# Patient Record
Sex: Female | Born: 1965 | Race: White | Hispanic: No | Marital: Married | Smoking: Never smoker
Health system: Southern US, Community
[De-identification: ages and names within clinical notes are randomized; demographics above are authoritative.]

## PROBLEM LIST (undated history)

## (undated) DIAGNOSIS — F329 Major depressive disorder, single episode, unspecified: Secondary | ICD-10-CM

## (undated) DIAGNOSIS — M779 Enthesopathy, unspecified: Secondary | ICD-10-CM

## (undated) DIAGNOSIS — F419 Anxiety disorder, unspecified: Secondary | ICD-10-CM

## (undated) DIAGNOSIS — F32A Depression, unspecified: Secondary | ICD-10-CM

## (undated) HISTORY — PX: COLPOSCOPY: SHX161

## (undated) HISTORY — DX: Anxiety disorder, unspecified: F41.9

## (undated) HISTORY — PX: DILATION AND CURETTAGE OF UTERUS: SHX78

## (undated) HISTORY — DX: Depression, unspecified: F32.A

## (undated) HISTORY — DX: Enthesopathy, unspecified: M77.9

## (undated) HISTORY — DX: Major depressive disorder, single episode, unspecified: F32.9

## (undated) HISTORY — PX: CARPAL TUNNEL RELEASE: SHX101

## (undated) HISTORY — PX: LASIK: SHX215

---

## 1990-02-27 HISTORY — PX: KNEE SURGERY: SHX244

## 2006-02-22 ENCOUNTER — Ambulatory Visit: Payer: Self-pay | Admitting: Specialist

## 2007-06-13 ENCOUNTER — Emergency Department: Payer: Self-pay | Admitting: Internal Medicine

## 2008-01-07 ENCOUNTER — Ambulatory Visit: Payer: Self-pay | Admitting: Unknown Physician Specialty

## 2010-06-14 ENCOUNTER — Ambulatory Visit: Payer: Self-pay | Admitting: Unknown Physician Specialty

## 2014-05-26 ENCOUNTER — Ambulatory Visit: Payer: Self-pay | Admitting: Obstetrics and Gynecology

## 2014-06-10 ENCOUNTER — Ambulatory Visit
Admit: 2014-06-10 | Disposition: A | Discharge status: Home / Self Care | Payer: Self-pay | Attending: Obstetrics and Gynecology | Admitting: Obstetrics and Gynecology

## 2014-08-13 ENCOUNTER — Ambulatory Visit (INDEPENDENT_AMBULATORY_CARE_PROVIDER_SITE_OTHER): Payer: BC Managed Care – PPO

## 2014-08-13 ENCOUNTER — Ambulatory Visit (INDEPENDENT_AMBULATORY_CARE_PROVIDER_SITE_OTHER): Payer: BC Managed Care – PPO | Admitting: Podiatry

## 2014-08-13 ENCOUNTER — Encounter: Payer: Self-pay | Admitting: Podiatry

## 2014-08-13 VITALS — BP 98/71 | HR 97 | Resp 17

## 2014-08-13 DIAGNOSIS — M79671 Pain in right foot: Secondary | ICD-10-CM | POA: Diagnosis not present

## 2014-08-13 DIAGNOSIS — M7671 Peroneal tendinitis, right leg: Secondary | ICD-10-CM | POA: Diagnosis not present

## 2014-08-13 MED ORDER — MELOXICAM 15 MG PO TABS
15.0000 mg | ORAL_TABLET | Freq: Every day | ORAL | Status: DC
Start: 2014-08-13 — End: 2018-09-06

## 2014-08-13 MED ORDER — MELOXICAM 15 MG PO TABS: 15.0000 mg | ORAL_TABLET | Freq: Every day | ORAL | Status: DC

## 2014-08-13 NOTE — Patient Instructions (Signed)
Peroneal Tendinitis with Rehab Tendonitis is inflammation of a tendon. Inflammation of the tendons on the back of the outer ankle (peroneal tendons) is known as peroneal tendonitis. The peroneal tendons are responsible for connecting the muscles that allow you to stand on your tiptoes to the bones of the ankle. For this reason, peroneal tendonitis often causes pain when trying to complete such motions. Peroneal tendonitis often involves a tear (strain) of the peroneal tendons. Strains are classified into three categories. Grade 1 strains cause pain, but the tendon is not lengthened. Grade 2 strains include a lengthened ligament, due to the ligament being stretched or partially ruptured. With grade 2 strains there is still function, although function may be decreased. Grade 3 strains involve a complete tear of the tendon or muscle, and function is usually impaired. SYMPTOMS   Pain, tenderness, swelling, warmth, or redness over the back of the outer side of the ankle, the outer part of the mid-foot, or the bottom of the arch.  Pain that gets worse with ankle motion (especially when pushing off or pushing down with the front of the foot), or when standing on the ball of the foot or pushing the foot outward.  Crackling sound (crepitation) when the tendon is moved or touched. CAUSES  Peroneal tendinitis occurs when injury to the peroneal tendons causes the body to respond with inflammation. Common causes of injury include:  An overuse injury, in which the groove behind the outer ankle (where the tendon is located) causes wear on the tendon.  A sudden stress placed on the tendon, such as from an increase in the intensity, frequency, or duration of training.  Direct hit (trauma) to the tendon.  Return to activity too soon after a previous ankle injury. RISK INCREASES WITH:  Sports that require sudden, repetitive pushing off of the foot, such as jumping or quick starts.  Kicking and running sports,  especially running down hills or long distances.  Poor strength and flexibility.  Previous injury to the foot, ankle, or leg. PREVENTION  Warm up and stretch properly before activity.  Allow for adequate recovery between workouts.  Maintain physical fitness:  Strength, flexibility, and endurance.  Cardiovascular fitness.  Complete rehabilitation after previous injury. PROGNOSIS  If treated properly, peroneal tendonitis usually heals within 6 weeks.  RELATED COMPLICATIONS  Longer healing time, if not properly treated or if not given enough time to heal.  Recurring symptoms if activity is resumed too soon, with overuse, or when using poor technique.  If untreated, tendinitis may result in tendon rupture, requiring surgery. TREATMENT  Treatment first involves the use of ice and medicine to reduce pain and inflammation. The use of strengthening and stretching exercises may help reduce pain with activity. These exercises may be performed at home or with a therapist. Sometimes, the foot and ankle will be restrained for 10 to 14 days to promote healing. Your caregiver may advise that you place a heel lift in your shoes to reduce the stress placed on the tendon. If nonsurgical treatment is unsuccessful, surgery to remove the inflamed tendon lining (sheath) may be advised.  MEDICATION   If pain medicine is needed, nonsteroidal anti-inflammatory medicines (aspirin and ibuprofen), or other minor pain relievers (acetaminophen), are often advised.  Do not take pain medicine for 7 days before surgery.  Prescription pain relievers may be given, if your caregiver thinks they are needed. Use only as directed and only as much as you need. HEAT AND COLD  Cold treatment (icing) should   be applied for 10 to 15 minutes every 2 to 3 hours for inflammation and pain, and immediately after activity that aggravates your symptoms. Use ice packs or an ice massage.  Heat treatment may be used before  performing stretching and strengthening activities prescribed by your caregiver, physical therapist, or athletic trainer. Use a heat pack or a warm water soak. SEEK MEDICAL CARE IF:  Symptoms get worse or do not improve in 2 to 4 weeks, despite treatment.  New, unexplained symptoms develop. (Drugs used in treatment may produce side effects.) EXERCISES RANGE OF MOTION (ROM) AND STRETCHING EXERCISES - Peroneal Tendinitis These exercises may help you when beginning to rehabilitate your injury. Your symptoms may resolve with or without further involvement from your physician, physical therapist or athletic trainer. While completing these exercises, remember:   Restoring tissue flexibility helps normal motion to return to the joints. This allows healthier, less painful movement and activity.  An effective stretch should be held for at least 30 seconds.  A stretch should never be painful. You should only feel a gentle lengthening or release in the stretched tissue. RANGE OF MOTION - Ankle Eversion  Sit with your right / left ankle crossed over your opposite knee.  Grip your foot with your opposite hand, placing your thumb on the top of your foot and your fingers across the bottom of your foot.  Gently push your foot downward with a slight rotation, so your littlest toes rise slightly toward the ceiling.  You should feel a gentle stretch on the inside of your ankle. Hold the stretch for __________ seconds. Repeat __________ times. Complete this exercise __________ times per day.  RANGE OF MOTION - Ankle Inversion  Sit with your right / left ankle crossed over your opposite knee.  Grip your foot with your opposite hand, placing your thumb on the bottom of your foot and your fingers across the top of your foot.  Gently pull your foot so the smallest toe comes toward you and your thumb pushes the inside of the ball of your foot away from you.  You should feel a gentle stretch on the outside of  your ankle. Hold the stretch for __________ seconds. Repeat __________ times. Complete this exercise __________ times per day.  RANGE OF MOTION - Ankle Plantar Flexion  Sit with your right / left leg crossed over your opposite knee.  Use your opposite hand to pull the top of your foot and toes toward you.  You should feel a gentle stretch on the top of your foot and ankle. Hold this position for __________ seconds. Repeat __________ times. Complete __________ times per day.  STRETCH - Gastroc, Standing  Place your hands on a wall.  Extend your right / left leg behind you, keeping the front knee somewhat bent.  Slightly point your toes inward on your back foot.  Keeping your right / left heel on the floor and your knee straight, shift your weight toward the wall, not allowing your back to arch.  You should feel a gentle stretch in the calf. Hold this position for __________ seconds. Repeat __________ times. Complete this stretch __________ times per day. STRETCH - Soleus, Standing  Place your hands on a wall.  Extend your right / left leg behind you, keeping the other knee somewhat bent.  Slightly point your toes inward on your back foot.  Keep your heel on the floor, bend your back knee, and slightly shift your weight over the back leg so that   you feel a gentle stretch deep in your back calf.  Hold this position for __________ seconds. Repeat __________ times. Complete this stretch __________ times per day. STRETCH - Gastrocsoleus, Standing Note: This exercise can place a lot of stress on your foot and ankle. Please complete this exercise only if specifically instructed by your caregiver.   Place the ball of your right / left foot on a step, keeping your other foot firmly on the same step.  Hold on to the wall or a rail for balance.  Slowly lift your other foot, allowing your body weight to press your heel down over the edge of the step.  You should feel a stretch in your  right / left calf.  Hold this position for __________ seconds.  Repeat this exercise with a slight bend in your knee. Repeat __________ times. Complete this stretch __________ times per day.  STRENGTHENING EXERCISES - Peroneal Tendinitis  These exercises may help you when beginning to rehabilitate your injury. They may resolve your symptoms with or without further involvement from your physician, physical therapist or athletic trainer. While completing these exercises, remember:   Muscles can gain both the endurance and the strength needed for everyday activities through controlled exercises.  Complete these exercises as instructed by your physician, physical therapist or athletic trainer. Increase the resistance and repetitions only as guided by your caregiver. STRENGTH - Dorsiflexors  Secure a rubber exercise band or tubing to a fixed object (table, pole) and loop the other end around your right / left foot.  Sit on the floor facing the fixed object. The band should be slightly tense when your foot is relaxed.  Slowly draw your foot back toward you, using your ankle and toes.  Hold this position for __________ seconds. Slowly release the tension in the band and return your foot to the starting position. Repeat __________ times. Complete this exercise __________ times per day.  STRENGTH - Towel Curls  Sit in a chair, on a non-carpeted surface.  Place your foot on a towel, keeping your heel on the floor.  Pull the towel toward your heel only by curling your toes. Keep your heel on the floor.  If instructed by your physician, physical therapist or athletic trainer, add weight to the end of the towel. Repeat __________ times. Complete this exercise __________ times per day. STRENGTH - Ankle Eversion   Secure one end of a rubber exercise band or tubing to a fixed object (table, pole). Loop the other end around your foot, just before your toes.  Place your fists between your knees.  This will focus your strengthening at your ankle.  Drawing the band across your opposite foot, away from the pole, slowly, pull your little toe out and up. Make sure the band is positioned to resist the entire motion.  Hold this position for __________ seconds.  Have your muscles resist the band, as it slowly pulls your foot back to the starting position. Repeat __________ times. Complete this exercise __________ times per day.  Document Released: 02/13/2005 Document Revised: 06/30/2013 Document Reviewed: 05/28/2008 [employer] Patient Information 2015 [employer]. This information is not intended to replace advice given to you by your health care provider. Make sure you discuss any questions you have with your health care provider.  

## 2014-08-14 NOTE — Progress Notes (Signed)
Patient ID: Lori Jenkins, female   DOB: 09/09/1965, 49 y.o.   MRN: [id number]  Subjective: 49 year old female presents the office as concerns her right foot pain which has been ongoing for which his been going on for approximately one month. She denies any history of injury or trauma. Denies any change or increase in activity on the time of onset of symptoms.  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 She states that she's had swelling to the outside part of her foot and she has pain with weightbearing/pressure. She states that she's been taking meloxicam which seems to help alleviate her pain. She's been icing the area as well as seems to help. No other complaints at this time.   ROS: All other systems were reviewed and are negative except for otherwise stated above.   Objective: AAO x3, NAD DP/PT pulses palpable bilaterally, CRT less than 3 seconds Protective sensation intact with Simms Weinstein monofilament, vibratory sensation intact, Achilles tendon reflex intact There is tenderness well patient on lateral aspect of the right foot overlying the course of the peroneal tendons inferior to the lateral malleolus and along the insertion of the fifth metatarsal base. There is tenderness along the fifth metatarsal base however there is no pain with vibratory sensation overlying the area. There is mild edema overlying the area without any overlying erythema or increase in warmth. There is mild discomfort with eversion of the foot. The peroneal tendons appear to be intact. There is no other areas of tenderness to bilateral lower extremities. MMT 5/5, ROM WNL.  No open lesions or pre-ulcerative lesions.  No other areas of overlying edema, erythema, increase in warmth to bilateral lower extremities.  No pain with calf compression, swelling, warmth, erythema bilaterally.   Assessment: 49 year old female right lateral foot pain, likely  insertional peroneal tendinitis  Plan: -X-rays were obtained and reviewed with the patient.  -Treatment options discussed including all alternatives, risks, and complications -Discussed likely etiology of her symptoms. -At this time recommended immobilization ankle brace for which he has at home. -Prescribed meloxicam. Discussed side effects the medication and directed to stop if any are to occur and call the office. -Ice to the area. -Discussed steroid injection however wishes to hold off at this time. -Continue the above exercises for approximate one to 2 weeks. After that point as her pain is decreased he can start rehabilitation exercises of the peroneal tendon. These exercises were discussed with her today. -Follow-up in 4 weeks or sooner if any problems are to arise. In the meantime I encouraged her to call the office with any questions, concerns, change in symptoms.

## 2014-09-17 ENCOUNTER — Ambulatory Visit: Payer: BC Managed Care – PPO | Admitting: Podiatry

## 2014-10-01 ENCOUNTER — Ambulatory Visit: Payer: BC Managed Care – PPO | Admitting: Podiatry

## 2014-10-15 ENCOUNTER — Ambulatory Visit: Payer: BC Managed Care – PPO | Admitting: Podiatry

## 2015-06-14 ENCOUNTER — Other Ambulatory Visit: Payer: Self-pay | Admitting: Obstetrics and Gynecology

## 2015-06-14 DIAGNOSIS — Z1231 Encounter for screening mammogram for malignant neoplasm of breast: Secondary | ICD-10-CM

## 2015-06-25 ENCOUNTER — Other Ambulatory Visit: Payer: Self-pay | Admitting: Obstetrics and Gynecology

## 2015-06-25 ENCOUNTER — Ambulatory Visit
Admission: RE | Admit: 2015-06-25 | Discharge: 2015-06-25 | Disposition: A | Discharge status: Home / Self Care | Payer: BC Managed Care – PPO | Source: Ambulatory Visit | Attending: Obstetrics and Gynecology | Admitting: Obstetrics and Gynecology

## 2015-06-25 ENCOUNTER — Ambulatory Visit: Payer: Self-pay

## 2015-06-25 DIAGNOSIS — Z1231 Encounter for screening mammogram for malignant neoplasm of breast: Secondary | ICD-10-CM | POA: Diagnosis not present

## 2016-07-31 ENCOUNTER — Ambulatory Visit (INDEPENDENT_AMBULATORY_CARE_PROVIDER_SITE_OTHER): Payer: BC Managed Care – PPO | Admitting: Obstetrics and Gynecology

## 2016-07-31 ENCOUNTER — Encounter: Payer: Self-pay | Admitting: Obstetrics and Gynecology

## 2016-07-31 VITALS — BP 116/70 | HR 98 | Ht 65.0 in | Wt 166.0 lb

## 2016-07-31 DIAGNOSIS — F419 Anxiety disorder, unspecified: Secondary | ICD-10-CM

## 2016-07-31 DIAGNOSIS — Z01419 Encounter for gynecological examination (general) (routine) without abnormal findings: Secondary | ICD-10-CM

## 2016-07-31 DIAGNOSIS — F329 Major depressive disorder, single episode, unspecified: Secondary | ICD-10-CM | POA: Diagnosis not present

## 2016-07-31 DIAGNOSIS — Z124 Encounter for screening for malignant neoplasm of cervix: Secondary | ICD-10-CM | POA: Diagnosis not present

## 2016-07-31 LAB — HM PAP SMEAR: HM Pap smear: NORMAL

## 2016-07-31 NOTE — Progress Notes (Signed)
Routine Annual Gynecology Examination   PCP: Will Bonnet, MD  Chief Complaint  Patient presents with  . Gynecologic Exam    History of Present Illness: Patient is a 51 y.o. [id number] presents for annual exam. The patient has no complaints today.   Normal menses, coming monthly, lasting 3 days, no menorrhagia, no intermenstrual bleeding  Menopausal symptoms: denies  Breast symptoms: denies  Last pap smear: 3 years ago.  Result Normal, HPV negative  Last mammogram: 1 years ago.  Result Normal   Past Medical History:  Diagnosis Date  . Anxiety and depression   . Tendonitis     Past Surgical History:  Procedure Laterality Date  . CARPAL TUNNEL RELEASE    . CESAREAN SECTION    . CESAREAN SECTION WITH BILATERAL TUBAL LIGATION    . COLPOSCOPY    . DILATION AND CURETTAGE OF UTERUS    . KNEE SURGERY Left 1992    Medications:   Medication Sig Start Date End Date Taking? Authorizing Provider  buPROPion [facility] SR) 150 MG 12 hr tablet  07/21/14   [provider]  meloxicam (MOBIC) 15 MG tablet Take 1 tablet (15 mg total) by mouth daily. 08/13/14   Trula Slade, DPM  sertraline (ZOLOFT) 50 MG tablet  06/22/14   [provider]    Allergies  Allergen Reactions  . Codeine     Gynecologic History:  Patient's last menstrual period was 06/11/2016 (approximate). Contraception: tubal ligation Last Pap: 3 years ago. Results were: normal Last mammogram: 1 year ago. Results were: normal  Obstetric History: [id number]   Social History   Social History  . Marital status: Single    Spouse name: N/A  . Number of children: N/A  . Years of education: N/A   Occupational History  . Not on file.   Social History Main Topics  . Smoking status: Never Smoker  . Smokeless tobacco: Never Used  . Alcohol use No  . Drug use: No  . Sexual activity: Yes   Other Topics Concern  . Not on file   Social History Narrative  . No narrative on file     Family History  Problem Relation Age of Onset  . Breast cancer Maternal Aunt   . Heart attack Father     Review of Systems  Constitutional: Negative.   HENT: Negative.   Eyes: Negative.   Respiratory: Negative.   Cardiovascular: Negative.   Gastrointestinal: Negative.   Genitourinary: Negative.   Musculoskeletal: Negative.   Skin: Negative.   Neurological: Negative.   Psychiatric/Behavioral: Negative.      Physical Exam Vitals: BP 116/70 (BP Location: Left Arm, Patient Position: Sitting, Cuff Size: Normal)   Pulse 98   Ht 5\' 5"  (1.651 m)   Wt 166 lb (75.3 kg)   LMP 06/11/2016 (Approximate)   BMI 27.62 kg/m   Physical Exam  Constitutional: She is oriented to person, place, and time and well-developed, well-nourished, and in no distress. No distress.  HENT:  Head: Normocephalic and atraumatic.  Eyes: Conjunctivae are normal. Left eye exhibits no discharge. No scleral icterus.  Neck: Normal range of motion. Neck supple. No thyromegaly present.  Cardiovascular: Normal rate, regular rhythm and normal heart sounds.  Exam reveals no gallop and no friction rub.   No murmur heard. Pulmonary/Chest: Effort normal and breath sounds normal. No respiratory distress. She has no wheezes. She has no rales. Right breast exhibits no inverted nipple, no mass, no nipple discharge, no skin change and  no tenderness. Left breast exhibits no inverted nipple, no mass, no nipple discharge, no skin change and no tenderness. Breasts are symmetrical.  Abdominal: Soft. Bowel sounds are normal. She exhibits no distension and no mass. There is no tenderness. There is no rebound and no guarding.  Genitourinary: Vagina normal, uterus normal, cervix normal, right adnexa normal, left adnexa normal and vulva normal.  Musculoskeletal: Normal range of motion. She exhibits no edema.  Lymphadenopathy:    She has no cervical adenopathy.  Neurological: She is alert and oriented to person, place, and time. No  cranial nerve deficit.  Skin: Skin is warm and dry. No rash noted.  Psychiatric: Mood, affect and judgment normal.     Female chaperone present for pelvic and breast  portions of the physical exam  Results: PHQ-9: 1   Assessment and Plan:  51 y.o. [id number] female here for routine gynecologic examination, doing well.  Anxiety and depression are well-controlled on medication.   Plan: Problem List Items Addressed This Visit    Anxiety and depression   Relevant Medications   buPROPion (WELLBUTRIN SR) 150 MG 12 hr tablet   sertraline (ZOLOFT) 50 MG tablet    Other Visit Diagnoses    Women's annual routine gynecological examination    -  Primary   Relevant Orders   IGP, Aptima HPV, rfx 16/18,45   Pap smear for cervical cancer screening       Relevant Orders   IGP, Aptima HPV, rfx 16/18,45      Screening: -- Blood pressure screen normal. -- Colonoscopy - due.  States she is establishing a new PCP and would like to have it ordered through her PCP. So, will await to see whether that is performed.  -- Mammogram - due. Patient will call [facility] to schedule. She may have her mammogram performed in [place], if that is more convenient for her.  -- Weight screening: overweight. Continue to monitor.  -- Nutrition: normal -- cholesterol screening: n/a -- osteoporosis screening: n/a -- tobacco screening: not using -- alcohol screening: AUDIT questionnaire indicates low-risk usage. -- family history of breast cancer screening: done. not at high risk. -- no evidence of domestic violence or intimate partner violence. -- STD screening: gonorrhea/chlamydia NAAT not collected per patient request. -- pap smear collected.  Anxiety and depression: well-controlled on current medication.  She does not want to come off the medication and has been stable on the medication for a while.  Will continue for now.   Prentice Docker, MD 08/02/2016 2:28 PM

## 2016-08-02 ENCOUNTER — Encounter: Payer: Self-pay | Admitting: Obstetrics and Gynecology

## 2016-08-02 DIAGNOSIS — F329 Major depressive disorder, single episode, unspecified: Secondary | ICD-10-CM | POA: Insufficient documentation

## 2016-08-02 DIAGNOSIS — F419 Anxiety disorder, unspecified: Secondary | ICD-10-CM

## 2016-08-02 DIAGNOSIS — F32A Depression, unspecified: Secondary | ICD-10-CM | POA: Insufficient documentation

## 2016-08-02 MED ORDER — SERTRALINE HCL 50 MG PO TABS: 50.0000 mg | ORAL_TABLET | Freq: Every day | ORAL | 3 refills | Status: DC

## 2016-08-02 MED ORDER — BUPROPION HCL ER (SR) 150 MG PO TB12: 150.0000 mg | ORAL_TABLET | Freq: Two times a day (BID) | ORAL | 3 refills | Status: DC

## 2016-08-03 ENCOUNTER — Encounter: Payer: Self-pay | Admitting: Obstetrics and Gynecology

## 2016-08-03 LAB — IGP, APTIMA HPV, RFX 16/18,45
HPV APTIMA: NEGATIVE
PAP SMEAR COMMENT: 0

## 2017-11-07 ENCOUNTER — Ambulatory Visit (INDEPENDENT_AMBULATORY_CARE_PROVIDER_SITE_OTHER): Payer: BC Managed Care – PPO | Admitting: Obstetrics and Gynecology

## 2017-11-07 ENCOUNTER — Encounter: Payer: Self-pay | Admitting: Obstetrics and Gynecology

## 2017-11-07 VITALS — BP 118/74 | Ht 65.0 in | Wt 162.0 lb

## 2017-11-07 DIAGNOSIS — G4709 Other insomnia: Secondary | ICD-10-CM | POA: Diagnosis not present

## 2017-11-07 DIAGNOSIS — G4700 Insomnia, unspecified: Secondary | ICD-10-CM | POA: Insufficient documentation

## 2017-11-07 DIAGNOSIS — F329 Major depressive disorder, single episode, unspecified: Secondary | ICD-10-CM

## 2017-11-07 DIAGNOSIS — Z1339 Encounter for screening examination for other mental health and behavioral disorders: Secondary | ICD-10-CM

## 2017-11-07 DIAGNOSIS — Z1231 Encounter for screening mammogram for malignant neoplasm of breast: Secondary | ICD-10-CM

## 2017-11-07 DIAGNOSIS — F419 Anxiety disorder, unspecified: Secondary | ICD-10-CM

## 2017-11-07 DIAGNOSIS — Z01419 Encounter for gynecological examination (general) (routine) without abnormal findings: Secondary | ICD-10-CM | POA: Diagnosis not present

## 2017-11-07 DIAGNOSIS — F418 Other specified anxiety disorders: Secondary | ICD-10-CM | POA: Diagnosis not present

## 2017-11-07 DIAGNOSIS — Z1239 Encounter for other screening for malignant neoplasm of breast: Secondary | ICD-10-CM

## 2017-11-07 DIAGNOSIS — Z1331 Encounter for screening for depression: Secondary | ICD-10-CM | POA: Diagnosis not present

## 2017-11-07 MED ORDER — SERTRALINE HCL 100 MG PO TABS: 100.0000 mg | ORAL_TABLET | Freq: Every day | ORAL | 3 refills | Status: DC

## 2017-11-07 MED ORDER — BUPROPION HCL ER (XL) 300 MG PO TB24: 300.0000 mg | ORAL_TABLET | Freq: Every day | ORAL | 3 refills | Status: DC

## 2017-11-07 MED ORDER — TRAZODONE HCL 50 MG PO TABS: ORAL_TABLET | ORAL | 0 refills | Status: DC

## 2017-11-07 NOTE — Progress Notes (Signed)
Routine Annual Gynecology Examination   PCP: Will Bonnet, MD  Chief Complaint  Patient presents with  . Annual Exam   History of Present Illness: Patient is a 52 y.o. [id number] presents for annual exam. The patient has no complaints today.   Menopausal bleeding: denies (> 1 year since last menses)  Menopausal symptoms: denies  Breast symptoms: denies  Last pap smear: 1 year ago.  Result Normal  Last mammogram: 2 years ago.  Result Normal  Depression: She continues to take sertraline and bupropion for depression and anxiety.  She has noted in mid July that her depression has gotten worse.  She has felt overwhelming between coaching, teaching class.  She is also caring for her mother who has dementia.  She has two daughters: one is in college and one is in 10th grade.  The worst feeling is that she just feels overwhelmed.  Denies SI/HI.  She spends an inordinate amount of time at work and has very little time for herself.  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  She has no new stressors in her life she can identify.  She is not sleeping well.  She has no trouble falling asleep.  Staying asleep is difficult.    Past Medical History:  Diagnosis Date  . Anxiety and depression   . Tendonitis    Past Surgical History:  Procedure Laterality Date  . CARPAL TUNNEL RELEASE    . CARPAL TUNNEL RELEASE    . CESAREAN SECTION    . CESAREAN SECTION WITH BILATERAL TUBAL LIGATION    . COLPOSCOPY    . DILATION AND CURETTAGE OF UTERUS    . KNEE SURGERY Left 1992   Prior to Admission medications   Medication Sig Start Date End Date Taking? Authorizing Provider  buPROPion (WELLBUTRIN SR) 150 MG 12 hr tablet Take 1 tablet (150 mg total) by mouth 2 (two) times daily. 08/02/16 10/31/16  Will Bonnet, MD  meloxicam (MOBIC) 15 MG tablet Take 1 tablet (15 mg total) by mouth daily. Patient not taking: Reported on 11/07/2017 08/13/14   Trula Slade, DPM  sertraline (ZOLOFT)  50 MG tablet Take 1 tablet (50 mg total) by mouth daily. 08/02/16 10/31/16  Will Bonnet, MD    Allergies  Allergen Reactions  . Codeine    Obstetric History: [id number]  Social History   Socioeconomic History  . Marital status: Single    Spouse name: Not on file  . Number of children: Not on file  . Years of education: Not on file  . Highest education level: Not on file  Occupational History  . Not on file  Social Needs  . Financial resource strain: Not on file  . Food insecurity:    Worry: Not on file    Inability: Not on file  . Transportation needs:    Medical: Not on file    Non-medical: Not on file  Tobacco Use  . Smoking status: Never Smoker  . Smokeless tobacco: Never Used  Substance and Sexual Activity  . Alcohol use: No    Alcohol/week: 0.0 standard drinks  . Drug use: No  . Sexual activity: Yes  Lifestyle  . Physical activity:    Days per week: Not on file    Minutes per session: Not on file  . Stress: Not on file  Relationships  . Social connections:    Talks on phone: Not on file    Gets together: Not on file  Attends religious service: Not on file    Active member of club or organization: Not on file    Attends meetings of clubs or organizations: Not on file    Relationship status: Not on file  . Intimate partner violence:    Fear of current or ex partner: Not on file    Emotionally abused: Not on file    Physically abused: Not on file    Forced sexual activity: Not on file  Other Topics Concern  . Not on file  Social History Narrative  . Not on file    Family History  Problem Relation Age of Onset  . Breast cancer Maternal Aunt   . Heart attack Father     Review of Systems  Constitutional: Negative.   HENT: Negative.   Eyes: Negative.   Respiratory: Negative.   Cardiovascular: Negative.   Gastrointestinal: Negative.   Genitourinary: Negative.   Musculoskeletal: Negative.   Skin: Negative.   Neurological: Negative.     Psychiatric/Behavioral: Positive for depression. Negative for hallucinations, memory loss, substance abuse and suicidal ideas. The patient is nervous/anxious and has insomnia.      Physical Exam Vitals: BP 118/74   Ht 5\' 5"  (1.651 m)   Wt 162 lb (73.5 kg)   BMI 26.96 kg/m   Physical Exam  Constitutional: She is oriented to person, place, and time. She appears well-developed and well-nourished. No distress.  Genitourinary: Uterus normal. Pelvic exam was performed with patient supine. There is no rash, tenderness, lesion or injury on the right labia. There is no rash, tenderness, lesion or injury on the left labia. No erythema, tenderness or bleeding in the vagina. No signs of injury around the vagina. No vaginal discharge found. Right adnexum does not display mass, does not display tenderness and does not display fullness. Left adnexum does not display mass, does not display tenderness and does not display fullness. Cervix does not exhibit motion tenderness, lesion, discharge or polyp.   Uterus is mobile and anteverted. Uterus is not enlarged, tender or exhibiting a mass.  HENT:  Head: Normocephalic and atraumatic.  Eyes: EOM are normal. No scleral icterus.  Neck: Normal range of motion. Neck supple. No thyromegaly present.  Cardiovascular: Normal rate and regular rhythm. Exam reveals no gallop and no friction rub.  No murmur heard. Pulmonary/Chest: Effort normal and breath sounds normal. No respiratory distress. She has no wheezes. She has no rales. Right breast exhibits no inverted nipple, no mass, no nipple discharge, no skin change and no tenderness. Left breast exhibits no inverted nipple, no mass, no nipple discharge, no skin change and no tenderness.  Abdominal: Soft. Bowel sounds are normal. She exhibits no distension and no mass. There is no tenderness. There is no rebound and no guarding.  Musculoskeletal: Normal range of motion. She exhibits no edema or tenderness.   Lymphadenopathy:    She has no cervical adenopathy.       Right: No inguinal adenopathy present.       Left: No inguinal adenopathy present.  Neurological: She is alert and oriented to person, place, and time. No cranial nerve deficit.  Skin: Skin is warm and dry. No rash noted. No erythema.  Psychiatric: She has a normal mood and affect. Her behavior is normal. Judgment normal.     Female chaperone present for pelvic and breast  portions of the physical exam  Results: AUDIT Questionnaire (screen for alcoholism): 1 PHQ-9: 15   Assessment and Plan:  52 y.o. [id number] female here  for routine annual gynecologic examination  Plan: Problem List Items Addressed This Visit      Other   Anxiety and depression   Relevant Medications   sertraline (ZOLOFT) 100 MG tablet   buPROPion (WELLBUTRIN XL) 300 MG 24 hr tablet   traZODone (DESYREL) 50 MG tablet   Insomnia   Relevant Medications   traZODone (DESYREL) 50 MG tablet    Other Visit Diagnoses    Women's annual routine gynecological examination    -  Primary   Relevant Orders   MM DIGITAL SCREENING BILATERAL   Screening for depression       Screening for alcoholism       Screening for breast cancer       Relevant Orders   MM DIGITAL SCREENING BILATERAL      Screening: -- Blood pressure screen normal -- Colonoscopy - due - managed by PCP -- Mammogram - due. Patient to call [facility] to arrange. She understands that it is her responsibility to arrange this. -- Weight screening: overweight: continue to monitor -- Depression screening: positive.  She has a history of depression. Increased dosing.  -- Nutrition: normal -- cholesterol screening: per PCP -- osteoporosis screening: not due -- tobacco screening: not using -- alcohol screening: AUDIT questionnaire indicates low-risk usage. -- family history of breast cancer screening: done. not at high risk. -- no evidence of domestic violence or intimate partner violence. -- STD  screening: gonorrhea/chlamydia NAAT not collected per patient request. -- pap smear not collected per ASCCP guidelines -- flu vaccine to receive at work. -- HPV vaccination series: not eligilbe  Depression/anxiety: Increase zoloft to 100mg  and wellbutrin dosing (she has been prescribed wellbutrin 300 mg, but has only been taking 150 mg.  Rx today for 300 mg). List of counselors provided and she was strongly encouraged to seek a counselor in addition to her current therapy. She is able to contract for safety today. Will monitor closely.  Insomnia: prescription for trazodone today for sleep.  Discussed theoretical risk of serotonin syndrome.    20 minutes spent in face to face discussion with > 50% spent in counseling,management, and coordination of care of her .  Anxiety and depression.   Prentice Docker, MD 11/08/2017 11:02 AM

## 2017-11-08 ENCOUNTER — Encounter: Payer: Self-pay | Admitting: Obstetrics and Gynecology

## 2018-03-12 ENCOUNTER — Other Ambulatory Visit: Payer: Self-pay

## 2018-03-12 DIAGNOSIS — G4709 Other insomnia: Secondary | ICD-10-CM

## 2018-03-13 NOTE — Telephone Encounter (Signed)
Please advise 

## 2018-03-13 NOTE — Telephone Encounter (Signed)
Your pt

## 2018-03-13 NOTE — Telephone Encounter (Signed)
She is your pt. Thx

## 2018-03-14 MED ORDER — TRAZODONE HCL 50 MG PO TABS: ORAL_TABLET | ORAL | 0 refills | Status: DC

## 2018-08-23 ENCOUNTER — Other Ambulatory Visit: Payer: Self-pay | Admitting: Obstetrics and Gynecology

## 2018-08-23 DIAGNOSIS — Z1231 Encounter for screening mammogram for malignant neoplasm of breast: Secondary | ICD-10-CM

## 2018-09-05 ENCOUNTER — Other Ambulatory Visit: Payer: Self-pay

## 2018-09-05 ENCOUNTER — Ambulatory Visit
Admission: RE | Admit: 2018-09-05 | Discharge: 2018-09-05 | Disposition: A | Discharge status: Home / Self Care | Payer: BC Managed Care – PPO | Source: Ambulatory Visit | Attending: Obstetrics and Gynecology | Admitting: Obstetrics and Gynecology

## 2018-09-05 DIAGNOSIS — Z1231 Encounter for screening mammogram for malignant neoplasm of breast: Secondary | ICD-10-CM

## 2018-09-05 DIAGNOSIS — G4709 Other insomnia: Secondary | ICD-10-CM

## 2018-09-05 MED ORDER — TRAZODONE HCL 50 MG PO TABS: ORAL_TABLET | ORAL | 0 refills | Status: DC

## 2018-09-05 NOTE — Telephone Encounter (Signed)
Please advise 

## 2018-09-06 ENCOUNTER — Other Ambulatory Visit: Payer: Self-pay | Admitting: Obstetrics and Gynecology

## 2018-09-06 DIAGNOSIS — R52 Pain, unspecified: Secondary | ICD-10-CM

## 2018-09-06 MED ORDER — MELOXICAM 15 MG PO TABS: 15.0000 mg | ORAL_TABLET | Freq: Every day | ORAL | 2 refills | Status: DC | PRN

## 2018-12-13 ENCOUNTER — Encounter: Payer: Self-pay | Admitting: Obstetrics and Gynecology

## 2018-12-13 ENCOUNTER — Other Ambulatory Visit: Payer: Self-pay

## 2018-12-13 ENCOUNTER — Ambulatory Visit (INDEPENDENT_AMBULATORY_CARE_PROVIDER_SITE_OTHER): Payer: BC Managed Care – PPO | Admitting: Obstetrics and Gynecology

## 2018-12-13 VITALS — BP 122/74 | Ht 65.0 in | Wt 159.0 lb

## 2018-12-13 DIAGNOSIS — F419 Anxiety disorder, unspecified: Secondary | ICD-10-CM

## 2018-12-13 DIAGNOSIS — Z1339 Encounter for screening examination for other mental health and behavioral disorders: Secondary | ICD-10-CM

## 2018-12-13 DIAGNOSIS — R350 Frequency of micturition: Secondary | ICD-10-CM

## 2018-12-13 DIAGNOSIS — Z1331 Encounter for screening for depression: Secondary | ICD-10-CM

## 2018-12-13 DIAGNOSIS — F32A Depression, unspecified: Secondary | ICD-10-CM

## 2018-12-13 DIAGNOSIS — Z01419 Encounter for gynecological examination (general) (routine) without abnormal findings: Secondary | ICD-10-CM | POA: Diagnosis not present

## 2018-12-13 DIAGNOSIS — F329 Major depressive disorder, single episode, unspecified: Secondary | ICD-10-CM

## 2018-12-13 MED ORDER — BUPROPION HCL ER (XL) 300 MG PO TB24: 300.0000 mg | ORAL_TABLET | Freq: Every day | ORAL | 3 refills | Status: DC

## 2018-12-13 MED ORDER — SERTRALINE HCL 100 MG PO TABS: 100.0000 mg | ORAL_TABLET | Freq: Every day | ORAL | 3 refills | Status: DC

## 2018-12-13 NOTE — Progress Notes (Signed)
Routine Annual Gynecology Examination   PCP: Will Bonnet, MD  Chief Complaint  Patient presents with  . Annual Exam   History of Present Illness: Patient is a 53 y.o. [id number] presents for annual exam. The patient has no complaints today.   Menopausal bleeding: denies (> 1 year since last menses)  Menopausal symptoms: denies  Breast symptoms: denies  Last pap smear: 2 years ago.  Result Normal  Last mammogram: 3 months ago.  Result Normal  Last colon cancer screening: [place], last year. Normal.  Depression: She continues to take sertraline and bupropion for depression and anxiety.  She is doing well on her current dosing of Zoloft and Wellbutrin. She is also sleeping better with the occasional use of trazodone.    She notes lower back pain. Initially the pain was on her right side. However, she picked something recently and it was on her left side.  The pain is in her very low back.  She also notes an increase in urinary frequency. She denies urinary leakage.  She denies dysuria.  She does have nocturia (1x per night).  Nothing makes the pain better or worse. The pain is constant. She rates the pain as mild, more of a constant dull ache.  She denies any associated symptoms.  She does have new constipation.    Past Medical History:  Diagnosis Date  . Anxiety and depression   . Tendonitis    Past Surgical History:  Procedure Laterality Date  . CARPAL TUNNEL RELEASE    . CARPAL TUNNEL RELEASE    . CESAREAN SECTION    . CESAREAN SECTION WITH BILATERAL TUBAL LIGATION    . COLPOSCOPY    . DILATION AND CURETTAGE OF UTERUS    . KNEE SURGERY Left 1992   Prior to Admission medications   Medication Sig Start Date End Date Taking? Authorizing Provider  buPROPion (WELLBUTRIN SR) 150 MG 12 hr tablet Take 1 tablet (150 mg total) by mouth 2 (two) times daily. 08/02/16 10/31/16  Will Bonnet, MD  sertraline (ZOLOFT) 50 MG tablet Take 1 tablet (50 mg total) by mouth daily.  08/02/16 10/31/16  Will Bonnet, MD    Allergies  Allergen Reactions  . Codeine    Obstetric History: [id number]  Social History   Socioeconomic History  . Marital status: Married    Spouse name: Not on file  . Number of children: Not on file  . Years of education: Not on file  . Highest education level: Not on file  Occupational History  . Not on file  Social Needs  . Financial resource strain: Not on file  . Food insecurity    Worry: Not on file    Inability: Not on file  . Transportation needs    Medical: Not on file    Non-medical: Not on file  Tobacco Use  . Smoking status: Never Smoker  . Smokeless tobacco: Never Used  Substance and Sexual Activity  . Alcohol use: No    Alcohol/week: 0.0 standard drinks  . Drug use: No  . Sexual activity: Yes  Lifestyle  . Physical activity    Days per week: Not on file    Minutes per session: Not on file  . Stress: Not on file  Relationships  . Social Herbalist on phone: Not on file    Gets together: Not on file    Attends religious service: Not on file    Active member of club or  organization: Not on file    Attends meetings of clubs or organizations: Not on file    Relationship status: Not on file  . Intimate partner violence    Fear of current or ex partner: Not on file    Emotionally abused: Not on file    Physically abused: Not on file    Forced sexual activity: Not on file  Other Topics Concern  . Not on file  Social History Narrative  . Not on file    Family History  Problem Relation Age of Onset  . Breast cancer Maternal Aunt   . Heart attack Father     Review of Systems  Constitutional: Negative.   HENT: Negative.   Eyes: Negative.   Respiratory: Negative.   Cardiovascular: Negative.   Gastrointestinal: Positive for constipation. Negative for abdominal pain, blood in stool, diarrhea, heartburn, melena, nausea and vomiting.  Genitourinary: Negative.   Musculoskeletal: Negative.   Skin:  Negative.   Neurological: Negative.   Psychiatric/Behavioral: Negative for depression, hallucinations, memory loss, substance abuse and suicidal ideas. The patient is not nervous/anxious and does not have insomnia.    Physical Exam Vitals: BP 122/74   Ht 5\' 5"  (1.651 m)   Wt 159 lb (72.1 kg)   LMP 06/11/2016 (Approximate)   BMI 26.46 kg/m   Physical Exam Constitutional:      General: She is not in acute distress.    Appearance: She is well-developed.  Genitourinary:     Pelvic exam was performed with patient supine.     Uterus normal.     No signs of injury in the vagina.     No vaginal discharge, erythema, tenderness or bleeding.     No cervical motion tenderness, discharge, lesion or polyp.     Uterus is mobile.     Uterus is not enlarged or tender.     No uterine mass detected.    Uterus is anteverted.     No right or left adnexal mass present.     Right adnexa not tender or full.     Left adnexa not tender or full.  HENT:     Head: Normocephalic and atraumatic.  Eyes:     General: No scleral icterus. Neck:     Musculoskeletal: Normal range of motion and neck supple.     Thyroid: No thyromegaly.  Cardiovascular:     Rate and Rhythm: Normal rate and regular rhythm.     Heart sounds: No murmur. No friction rub. No gallop.   Pulmonary:     Effort: Pulmonary effort is normal. No respiratory distress.     Breath sounds: Normal breath sounds. No wheezing or rales.  Chest:     Breasts:        Right: No inverted nipple, mass, nipple discharge, skin change or tenderness.        Left: No inverted nipple, mass, nipple discharge, skin change or tenderness.  Abdominal:     General: Bowel sounds are normal. There is no distension.     Palpations: Abdomen is soft. There is no mass.     Tenderness: There is no abdominal tenderness. There is no guarding or rebound.  Musculoskeletal: Normal range of motion.        General: No tenderness.  Lymphadenopathy:     Cervical: No  cervical adenopathy.     Lower Body: No right inguinal adenopathy. No left inguinal adenopathy.  Neurological:     Mental Status: She is alert and oriented to person,  place, and time.     Cranial Nerves: No cranial nerve deficit.  Skin:    General: Skin is warm and dry.     Findings: No erythema or rash.  Psychiatric:        Behavior: Behavior normal.        Judgment: Judgment normal.      Female chaperone present for pelvic and breast  portions of the physical exam  Results: AUDIT Questionnaire (screen for alcoholism): 1 PHQ-9: 1  Assessment and Plan:  53 y.o. [id number] female here for routine annual gynecologic examination  Plan: Problem List Items Addressed This Visit      Other   Anxiety and depression   Relevant Medications   buPROPion (WELLBUTRIN XL) 300 MG 24 hr tablet   sertraline (ZOLOFT) 100 MG tablet    Other Visit Diagnoses    Women's annual routine gynecological examination    -  Primary   Relevant Medications   buPROPion (WELLBUTRIN XL) 300 MG 24 hr tablet   sertraline (ZOLOFT) 100 MG tablet   Other Relevant Orders   Urine Culture   Screening for depression       Screening for alcoholism       Frequency of urination       Relevant Orders   Urine Culture      Screening: -- Blood pressure screen normal -- Colonoscopy - not due -- Mammogram - not due -- Weight screening: overweight: continue to monitor -- Depression screening: positive.  She has a history of depression. Increased dosing.  -- Nutrition: normal -- cholesterol screening: per PCP -- osteoporosis screening: not due -- tobacco screening: not using -- alcohol screening: AUDIT questionnaire indicates low-risk usage. -- family history of breast cancer screening: done. not at high risk. -- no evidence of domestic violence or intimate partner violence. -- STD screening: gonorrhea/chlamydia NAAT not collected per patient request. -- pap smear not collected per ASCCP guidelines -- flu vaccine  to receive at work. -- HPV vaccination series: not eligilbe  Depression/anxiety: Continue current medication as it seems to be working well.   Frequency: urine culture  Prentice Docker, MD 12/13/2018 3:36 PM

## 2018-12-15 LAB — URINE CULTURE

## 2018-12-20 ENCOUNTER — Other Ambulatory Visit: Payer: Self-pay | Admitting: Obstetrics and Gynecology

## 2018-12-20 DIAGNOSIS — N3000 Acute cystitis without hematuria: Secondary | ICD-10-CM

## 2018-12-20 MED ORDER — AMOXICILLIN 875 MG PO TABS: 875.0000 mg | ORAL_TABLET | Freq: Two times a day (BID) | ORAL | 0 refills | Status: AC

## 2019-05-28 ENCOUNTER — Other Ambulatory Visit: Payer: Self-pay

## 2019-05-28 DIAGNOSIS — G4709 Other insomnia: Secondary | ICD-10-CM

## 2019-05-29 MED ORDER — TRAZODONE HCL 50 MG PO TABS: ORAL_TABLET | ORAL | 3 refills | Status: DC

## 2020-01-12 ENCOUNTER — Ambulatory Visit (INDEPENDENT_AMBULATORY_CARE_PROVIDER_SITE_OTHER): Payer: BC Managed Care – PPO | Admitting: Obstetrics and Gynecology

## 2020-01-12 ENCOUNTER — Other Ambulatory Visit: Payer: Self-pay

## 2020-01-12 ENCOUNTER — Encounter: Payer: Self-pay | Admitting: Obstetrics and Gynecology

## 2020-01-12 VITALS — BP 126/74 | Ht 65.0 in | Wt 161.0 lb

## 2020-01-12 DIAGNOSIS — Z1211 Encounter for screening for malignant neoplasm of colon: Secondary | ICD-10-CM

## 2020-01-12 DIAGNOSIS — F419 Anxiety disorder, unspecified: Secondary | ICD-10-CM | POA: Diagnosis not present

## 2020-01-12 DIAGNOSIS — F32A Depression, unspecified: Secondary | ICD-10-CM

## 2020-01-12 MED ORDER — BUPROPION HCL ER (XL) 300 MG PO TB24: 300.0000 mg | ORAL_TABLET | Freq: Every day | ORAL | 4 refills | Status: AC

## 2020-01-12 MED ORDER — SERTRALINE HCL 100 MG PO TABS: 100.0000 mg | ORAL_TABLET | Freq: Every day | ORAL | 3 refills | Status: AC

## 2020-01-12 NOTE — Progress Notes (Signed)
Obstetrics & Gynecology Office Visit   Chief Complaint  Patient presents with  . Follow-up    medication follow up   History of Present Illness: 54 y.o. Lori Jenkins female who presents for a follow up for anxiety and depression. She is currently taking sertraline and bupropion and trazodone (as needed).  She states that she is doing very well.  She has been on the medication for 24 years.  She does not want to change anything at this point.  She states that since her postpartum period she really hasn't had issues with depression, per se.  She has had mood fluctuations over the years.   Past Medical History:  Diagnosis Date  . Anxiety and depression   . Tendonitis    Past Surgical History:  Procedure Laterality Date  . CARPAL TUNNEL RELEASE    . CARPAL TUNNEL RELEASE    . CESAREAN SECTION    . CESAREAN SECTION WITH BILATERAL TUBAL LIGATION    . COLPOSCOPY    . DILATION AND CURETTAGE OF UTERUS    . KNEE SURGERY Left 1992   Gynecologic History: Patient's last menstrual period was 06/11/2016 (approximate).  Obstetric History: [id number]  Family History  Problem Relation Age of Onset  . Breast cancer Maternal Aunt   . Heart attack Father    Social History   Socioeconomic History  . Marital status: Married    Spouse name: Not on file  . Number of children: Not on file  . Years of education: Not on file  . Highest education level: Not on file  Occupational History  . Not on file  Tobacco Use  . Smoking status: Never Smoker  . Smokeless tobacco: Never Used  Vaping Use  . Vaping Use: Never used  Substance and Sexual Activity  . Alcohol use: No    Alcohol/week: 0.0 standard drinks  . Drug use: No  . Sexual activity: Yes  Other Topics Concern  . Not on file  Social History Narrative  . Not on file   Social Determinants of Health   Financial Resource Strain:   . Difficulty of Paying Living Expenses: Not on file  Food Insecurity:   . Worried About Charity fundraiser in  the Last Year: Not on file  . Ran Out of Food in the Last Year: Not on file  Transportation Needs:   . Lack of Transportation (Medical): Not on file  . Lack of Transportation (Non-Medical): Not on file  Physical Activity:   . Days of Exercise per Week: Not on file  . Minutes of Exercise per Session: Not on file  Stress:   . Feeling of Stress : Not on file  Social Connections:   . Frequency of Communication with Friends and Family: Not on file  . Frequency of Social Gatherings with Friends and Family: Not on file  . Attends Religious Services: Not on file  . Active Member of Clubs or Organizations: Not on file  . Attends Archivist Meetings: Not on file  . Marital Status: Not on file  Intimate Partner Violence:   . Fear of Current or Ex-Partner: Not on file  . Emotionally Abused: Not on file  . Physically Abused: Not on file  . Sexually Abused: Not on file    Allergies  Allergen Reactions  . Codeine     Prior to Admission medications   Medication Sig Start Date End Date Taking? Authorizing Provider  buPROPion (WELLBUTRIN XL) 300 MG 24 hr tablet Take 1 tablet (  300 mg total) by mouth daily. 12/13/18   Will Bonnet, MD  meloxicam (MOBIC) 15 MG tablet Take 1 tablet (15 mg total) by mouth daily as needed for pain. 09/06/18   Will Bonnet, MD  sertraline (ZOLOFT) 100 MG tablet Take 1 tablet (100 mg total) by mouth daily. 12/13/18 03/13/19  Will Bonnet, MD  traZODone (DESYREL) 50 MG tablet Take 1-2 tablets at bedtime, as needed for insomnia 05/29/19   Will Bonnet, MD    Review of Systems  Constitutional: Negative.   HENT: Negative.   Eyes: Negative.   Respiratory: Negative.   Cardiovascular: Negative.   Gastrointestinal: Negative.   Genitourinary: Negative.   Musculoskeletal: Negative.   Skin: Negative.   Neurological: Negative.   Psychiatric/Behavioral: Negative.      Physical Exam BP 126/74   Ht 5\' 5"  (1.651 m)   Wt 161 lb (73 kg)   LMP  06/11/2016 (Approximate)   BMI 26.79 kg/m  Patient's last menstrual period was 06/11/2016 (approximate). Physical Exam Constitutional:      General: She is not in acute distress.    Appearance: Normal appearance.  HENT:     Head: Normocephalic and atraumatic.  Eyes:     General: No scleral icterus.    Conjunctiva/sclera: Conjunctivae normal.  Neurological:     General: No focal deficit present.     Mental Status: She is alert and oriented to person, place, and time.     Cranial Nerves: No cranial nerve deficit.  Psychiatric:        Mood and Affect: Mood normal.        Behavior: Behavior normal.        Judgment: Judgment normal.    Female chaperone present for pelvic and breast  portions of the physical exam  PHQ9: 0 GAD7: 0  Assessment: 54 y.o. Lori Jenkins female here for  1. Anxiety and depression      Plan: Problem List Items Addressed This Visit      Other   Anxiety and depression - Primary   Relevant Medications   buPROPion (WELLBUTRIN XL) 300 MG 24 hr tablet   sertraline (ZOLOFT) 100 MG tablet     The patient is doing very well on her current medications, which she has been taking for many years.  She does not wish to make any changes to her medications at this time.  We did discuss trying to wean off these medications over time and she is not interested in attempting this at this moment.  A total of 20 minutes were spent face-to-face with the patient as well as preparation, review, communication, and documentation during this encounter.    Prentice Docker, MD 01/12/2020 3:44 PM

## 2020-01-19 ENCOUNTER — Telehealth (INDEPENDENT_AMBULATORY_CARE_PROVIDER_SITE_OTHER): Payer: Self-pay | Admitting: Gastroenterology

## 2020-01-19 ENCOUNTER — Other Ambulatory Visit: Payer: Self-pay

## 2020-01-19 DIAGNOSIS — Z1211 Encounter for screening for malignant neoplasm of colon: Secondary | ICD-10-CM

## 2020-01-19 DIAGNOSIS — F988 Other specified behavioral and emotional disorders with onset usually occurring in childhood and adolescence: Secondary | ICD-10-CM | POA: Insufficient documentation

## 2020-01-19 DIAGNOSIS — F419 Anxiety disorder, unspecified: Secondary | ICD-10-CM | POA: Insufficient documentation

## 2020-01-19 MED ORDER — PEG 3350-KCL-NA BICARB-NACL 420 G PO SOLR: 4000.0000 mL | Freq: Once | ORAL | 0 refills | Status: AC

## 2020-01-19 NOTE — Progress Notes (Signed)
Gastroenterology Pre-Procedure Review  Request Date: Friday 02/13/20 Requesting Physician: Dr. Allen Norris  PATIENT REVIEW QUESTIONS: The patient responded to the following health history questions as indicated:    1. Are you having any GI issues? no 2. Do you have a personal history of Polyps? no 3. Do you have a family history of Colon Cancer or Polyps? no 4. Diabetes Mellitus? no 5. Joint replacements in the past 12 months?no 6. Major health problems in the past 3 months?no 7. Any artificial heart valves, MVP, or defibrillator?no    MEDICATIONS & ALLERGIES:    Patient reports the following regarding taking any anticoagulation/antiplatelet therapy:   Plavix, Coumadin, Eliquis, Xarelto, Lovenox, Pradaxa, Brilinta, or Effient? no Aspirin? no  Patient confirms/reports the following medications:  Current Outpatient Medications  Medication Sig Dispense Refill  . buPROPion (WELLBUTRIN XL) 300 MG 24 hr tablet Take 1 tablet (300 mg total) by mouth daily. 90 tablet 4  . meloxicam (MOBIC) 15 MG tablet Take 1 tablet (15 mg total) by mouth daily as needed for pain. 30 tablet 2  . sertraline (ZOLOFT) 100 MG tablet Take 1 tablet (100 mg total) by mouth daily. 90 tablet 3  . traZODone (DESYREL) 50 MG tablet Take 1-2 tablets at bedtime, as needed for insomnia 90 tablet 3   No current facility-administered medications for this visit.    Patient confirms/reports the following allergies:  Allergies  Allergen Reactions  . Codeine     No orders of the defined types were placed in this encounter.   AUTHORIZATION INFORMATION Primary Insurance: 1D#: Group #:  Secondary Insurance: 1D#: Group #:  SCHEDULE INFORMATION: Date: Friday 02/13/20 Time: Location:[facility]

## 2020-02-11 ENCOUNTER — Encounter: Payer: Self-pay | Admitting: Gastroenterology

## 2020-02-11 ENCOUNTER — Other Ambulatory Visit: Payer: Self-pay

## 2020-02-11 ENCOUNTER — Other Ambulatory Visit
Admission: RE | Admit: 2020-02-11 | Discharge: 2020-02-11 | Disposition: A | Discharge status: Home / Self Care | Payer: BC Managed Care – PPO | Source: Ambulatory Visit | Attending: Gastroenterology | Admitting: Gastroenterology

## 2020-02-11 DIAGNOSIS — Z803 Family history of malignant neoplasm of breast: Secondary | ICD-10-CM | POA: Diagnosis not present

## 2020-02-11 DIAGNOSIS — Z1211 Encounter for screening for malignant neoplasm of colon: Secondary | ICD-10-CM | POA: Diagnosis not present

## 2020-02-11 DIAGNOSIS — Z01812 Encounter for preprocedural laboratory examination: Secondary | ICD-10-CM | POA: Insufficient documentation

## 2020-02-11 DIAGNOSIS — Z20822 Contact with and (suspected) exposure to covid-19: Secondary | ICD-10-CM | POA: Insufficient documentation

## 2020-02-11 DIAGNOSIS — K635 Polyp of colon: Secondary | ICD-10-CM | POA: Diagnosis not present

## 2020-02-11 DIAGNOSIS — K640 First degree hemorrhoids: Secondary | ICD-10-CM | POA: Diagnosis not present

## 2020-02-11 DIAGNOSIS — Z885 Allergy status to narcotic agent status: Secondary | ICD-10-CM | POA: Diagnosis not present

## 2020-02-11 DIAGNOSIS — Z79899 Other long term (current) drug therapy: Secondary | ICD-10-CM | POA: Diagnosis not present

## 2020-02-11 DIAGNOSIS — Z91013 Allergy to seafood: Secondary | ICD-10-CM | POA: Diagnosis not present

## 2020-02-11 DIAGNOSIS — Z8249 Family history of ischemic heart disease and other diseases of the circulatory system: Secondary | ICD-10-CM | POA: Diagnosis not present

## 2020-02-12 LAB — SARS CORONAVIRUS 2 (TAT 6-24 HRS): SARS Coronavirus 2: NEGATIVE

## 2020-02-12 NOTE — Discharge Instructions (Signed)
General Anesthesia, Adult, Care After This sheet gives you information about how to care for yourself after your procedure. Your health care provider may also give you more specific instructions. If you have problems or questions, contact your health care provider. What can I expect after the procedure? After the procedure, the following side effects are common:  Pain or discomfort at the IV site.  Nausea.  Vomiting.  Sore throat.  Trouble concentrating.  Feeling cold or chills.  Weak or tired.  Sleepiness and fatigue.  Soreness and body aches. These side effects can affect parts of the body that were not involved in surgery. Follow these instructions at home:  For at least 24 hours after the procedure:  Have a responsible adult stay with you. It is important to have someone help care for you until you are awake and alert.  Rest as needed.  Do not: ? Participate in activities in which you could fall or become injured. ? Drive. ? Use heavy machinery. ? Drink alcohol. ? Take sleeping pills or medicines that cause drowsiness. ? Make important decisions or sign legal documents. ? Take care of children on your own. Eating and drinking  Follow any instructions from your health care provider about eating or drinking restrictions.  When you feel hungry, start by eating small amounts of foods that are soft and easy to digest (bland), such as toast. Gradually return to your regular diet.  Drink enough fluid to keep your urine pale yellow.  If you vomit, rehydrate by drinking water, juice, or clear broth. General instructions  If you have sleep apnea, surgery and certain medicines can increase your risk for breathing problems. Follow instructions from your health care provider about wearing your sleep device: ? Anytime you are sleeping, including during daytime naps. ? While taking prescription pain medicines, sleeping medicines, or medicines that make you drowsy.  Return to  your normal activities as told by your health care provider. Ask your health care provider what activities are safe for you.  Take over-the-counter and prescription medicines only as told by your health care provider.  If you smoke, do not smoke without supervision.  Keep all follow-up visits as told by your health care provider. This is important. Contact a health care provider if:  You have nausea or vomiting that does not get better with medicine.  You cannot eat or drink without vomiting.  You have pain that does not get better with medicine.  You are unable to pass urine.  You develop a skin rash.  You have a fever.  You have redness around your IV site that gets worse. Get help right away if:  You have difficulty breathing.  You have chest pain.  You have blood in your urine or stool, or you vomit blood. Summary  After the procedure, it is common to have a sore throat or nausea. It is also common to feel tired.  Have a responsible adult stay with you for the first 24 hours after general anesthesia. It is important to have someone help care for you until you are awake and alert.  When you feel hungry, start by eating small amounts of foods that are soft and easy to digest (bland), such as toast. Gradually return to your regular diet.  Drink enough fluid to keep your urine pale yellow.  Return to your normal activities as told by your health care provider. Ask your health care provider what activities are safe for you. This information is not   intended to replace advice given to you by your health care provider. Make sure you discuss any questions you have with your health care provider. Document Revised: 02/16/2017 Document Reviewed: 09/29/2016 Elsevier Patient Education  2020 [employer]  

## 2020-02-13 ENCOUNTER — Ambulatory Visit: Payer: BC Managed Care – PPO | Admitting: Anesthesiology

## 2020-02-13 ENCOUNTER — Ambulatory Visit
Admission: RE | Admit: 2020-02-13 | Discharge: 2020-02-13 | Disposition: A | Discharge status: Home / Self Care | Payer: BC Managed Care – PPO | Attending: Gastroenterology | Admitting: Gastroenterology

## 2020-02-13 ENCOUNTER — Encounter
Admission: RE | Disposition: A | Discharge status: Home / Self Care | Payer: Self-pay | Source: Home / Self Care | Attending: Gastroenterology

## 2020-02-13 ENCOUNTER — Other Ambulatory Visit: Payer: Self-pay

## 2020-02-13 ENCOUNTER — Encounter: Payer: Self-pay | Admitting: Gastroenterology

## 2020-02-13 DIAGNOSIS — Z91013 Allergy to seafood: Secondary | ICD-10-CM | POA: Insufficient documentation

## 2020-02-13 DIAGNOSIS — K635 Polyp of colon: Secondary | ICD-10-CM

## 2020-02-13 DIAGNOSIS — Z8249 Family history of ischemic heart disease and other diseases of the circulatory system: Secondary | ICD-10-CM | POA: Insufficient documentation

## 2020-02-13 DIAGNOSIS — K640 First degree hemorrhoids: Secondary | ICD-10-CM | POA: Insufficient documentation

## 2020-02-13 DIAGNOSIS — Z803 Family history of malignant neoplasm of breast: Secondary | ICD-10-CM | POA: Insufficient documentation

## 2020-02-13 DIAGNOSIS — Z885 Allergy status to narcotic agent status: Secondary | ICD-10-CM | POA: Insufficient documentation

## 2020-02-13 DIAGNOSIS — Z1211 Encounter for screening for malignant neoplasm of colon: Secondary | ICD-10-CM | POA: Diagnosis not present

## 2020-02-13 DIAGNOSIS — Z79899 Other long term (current) drug therapy: Secondary | ICD-10-CM | POA: Insufficient documentation

## 2020-02-13 DIAGNOSIS — Z20822 Contact with and (suspected) exposure to covid-19: Secondary | ICD-10-CM | POA: Insufficient documentation

## 2020-02-13 HISTORY — PX: POLYPECTOMY: SHX5525

## 2020-02-13 HISTORY — PX: COLONOSCOPY WITH PROPOFOL: SHX5780

## 2020-02-13 SURGERY — COLONOSCOPY WITH PROPOFOL
Anesthesia: General

## 2020-02-13 MED ORDER — MEPERIDINE HCL 25 MG/ML IJ SOLN: 6.2500 mg | INTRAMUSCULAR | Status: DC | PRN

## 2020-02-13 MED ORDER — LACTATED RINGERS IV SOLN: INTRAVENOUS | Status: DC | PRN

## 2020-02-13 MED ORDER — LACTATED RINGERS IV SOLN: INTRAVENOUS | Status: DC

## 2020-02-13 MED ORDER — PROMETHAZINE HCL 25 MG/ML IJ SOLN: 6.2500 mg | INTRAMUSCULAR | Status: DC | PRN

## 2020-02-13 MED ORDER — OXYCODONE HCL 5 MG/5ML PO SOLN: 5.0000 mg | Freq: Once | ORAL | Status: DC | PRN

## 2020-02-13 MED ORDER — SODIUM CHLORIDE 0.9 % IV SOLN: INTRAVENOUS | Status: DC

## 2020-02-13 MED ORDER — STERILE WATER FOR IRRIGATION IR SOLN: Status: DC | PRN

## 2020-02-13 MED ORDER — FENTANYL CITRATE (PF) 100 MCG/2ML IJ SOLN: 25.0000 ug | INTRAMUSCULAR | Status: DC | PRN

## 2020-02-13 MED ORDER — LIDOCAINE HCL (CARDIAC) PF 100 MG/5ML IV SOSY
PREFILLED_SYRINGE | INTRAVENOUS | Status: DC | PRN
  Administered 2020-02-13: 50 mg via INTRAVENOUS

## 2020-02-13 MED ORDER — PROPOFOL 10 MG/ML IV BOLUS
INTRAVENOUS | Status: DC | PRN
  Administered 2020-02-13: 20 mg via INTRAVENOUS
  Administered 2020-02-13: 30 mg via INTRAVENOUS
  Administered 2020-02-13 (×2): 20 mg via INTRAVENOUS
  Administered 2020-02-13: 30 mg via INTRAVENOUS
  Administered 2020-02-13: 20 mg via INTRAVENOUS
  Administered 2020-02-13: 30 mg via INTRAVENOUS
  Administered 2020-02-13: 80 mg via INTRAVENOUS

## 2020-02-13 MED ORDER — OXYCODONE HCL 5 MG PO TABS: 5.0000 mg | ORAL_TABLET | Freq: Once | ORAL | Status: DC | PRN

## 2020-02-13 SURGICAL SUPPLY — 25 items
CLIP HMST 235XBRD CATH ROT (MISCELLANEOUS) IMPLANT
CLIP RESOLUTION 360 11X235 (MISCELLANEOUS)
ELECT REM PT RETURN 9FT ADLT (ELECTROSURGICAL)
ELECTRODE REM PT RTRN 9FT ADLT (ELECTROSURGICAL) IMPLANT
FCP ESCP3.2XJMB 240X2.8X (MISCELLANEOUS)
FORCEPS BIOP RAD 4 LRG CAP 4 (CUTTING FORCEPS) IMPLANT
FORCEPS BIOP RJ4 240 W/NDL (MISCELLANEOUS)
FORCEPS ESCP3.2XJMB 240X2.8X (MISCELLANEOUS) IMPLANT
GOWN CVR UNV OPN BCK APRN NK (MISCELLANEOUS) ×4 IMPLANT
GOWN ISOL THUMB LOOP REG UNIV (MISCELLANEOUS) ×8
INJECTOR VARIJECT VIN23 (MISCELLANEOUS) IMPLANT
KIT DEFENDO VALVE AND CONN (KITS) IMPLANT
KIT PRC NS LF DISP ENDO (KITS) ×2 IMPLANT
KIT PROCEDURE OLYMPUS (KITS) ×4
MANIFOLD NEPTUNE II (INSTRUMENTS) ×4 IMPLANT
MARKER SPOT ENDO TATTOO 5ML (MISCELLANEOUS) IMPLANT
PROBE APC STR FIRE (PROBE) IMPLANT
RETRIEVER NET ROTH 2.5X230 LF (MISCELLANEOUS) IMPLANT
SNARE SHORT THROW 13M SML OVAL (MISCELLANEOUS) ×4 IMPLANT
SNARE SHORT THROW 30M LRG OVAL (MISCELLANEOUS) IMPLANT
SNARE SNG USE RND 15MM (INSTRUMENTS) IMPLANT
SPOT EX ENDOSCOPIC TATTOO (MISCELLANEOUS)
TRAP ETRAP POLY (MISCELLANEOUS) ×4 IMPLANT
VARIJECT INJECTOR VIN23 (MISCELLANEOUS)
WATER STERILE IRR 250ML POUR (IV SOLUTION) ×4 IMPLANT

## 2020-02-13 NOTE — Anesthesia Postprocedure Evaluation (Signed)
Anesthesia Post Note  Patient: Lori Jenkins  Procedure(s) Performed: COLONOSCOPY WITH PROPOFOL (N/A ) POLYPECTOMY     Patient location during evaluation: PACU Anesthesia Type: General Level of consciousness: awake Pain management: pain level controlled Vital Signs Assessment: post-procedure vital signs reviewed and stable Respiratory status: respiratory function stable Cardiovascular status: stable Postop Assessment: no signs of nausea or vomiting Anesthetic complications: no   No complications documented.  Veda Canning

## 2020-02-13 NOTE — Anesthesia Preprocedure Evaluation (Signed)
Anesthesia Evaluation  Patient identified by MRN, date of birth, ID band Patient awake    Reviewed: Allergy & Precautions, H&P , NPO status , Patient's Chart, lab work & pertinent test results, reviewed documented beta blocker date and time   Airway Mallampati: II  TM Distance: >3 FB Neck ROM: full    Dental no notable dental hx.    Pulmonary neg pulmonary ROS,    Pulmonary exam normal breath sounds clear to auscultation       Cardiovascular Exercise Tolerance: Good negative cardio ROS   Rhythm:regular Rate:Normal     Neuro/Psych negative neurological ROS  negative psych ROS   GI/Hepatic negative GI ROS, Neg liver ROS,   Endo/Other  negative endocrine ROS  Renal/GU negative Renal ROS  negative genitourinary   Musculoskeletal   Abdominal   Peds  Hematology negative hematology ROS (+)   Anesthesia Other Findings   Reproductive/Obstetrics negative OB ROS                             Anesthesia Physical Anesthesia Plan  ASA: II  Anesthesia Plan: General   Post-op Pain Management:    Induction:   PONV Risk Score and Plan: 3 and Propofol infusion, TIVA and Treatment may vary due to age or medical condition  Airway Management Planned:   Additional Equipment:   Intra-op Plan:   Post-operative Plan:   Informed Consent: I have reviewed the patients History and Physical, chart, labs and discussed the procedure including the risks, benefits and alternatives for the proposed anesthesia with the patient or authorized representative who has indicated his/her understanding and acceptance.     Dental Advisory Given  Plan Discussed with: CRNA  Anesthesia Plan Comments:         Anesthesia Quick Evaluation

## 2020-02-13 NOTE — Transfer of Care (Signed)
Immediate Anesthesia Transfer of Care Note  Patient: Lori Jenkins  Procedure(s) Performed: COLONOSCOPY WITH PROPOFOL (N/A ) POLYPECTOMY  Patient Location: PACU  Anesthesia Type: General  Level of Consciousness: awake, alert  and patient cooperative  Airway and Oxygen Therapy: Patient Spontanous Breathing and Patient connected to supplemental oxygen  Post-op Assessment: Post-op Vital signs reviewed, Patient's Cardiovascular Status Stable, Respiratory Function Stable, Patent Airway and No signs of Nausea or vomiting  Post-op Vital Signs: Reviewed and stable  Complications: No complications documented.

## 2020-02-13 NOTE — Anesthesia Procedure Notes (Signed)
Performed by: [employer], CRNA Pre-anesthesia Checklist: Patient identified, Emergency Drugs available, Suction available, Timeout performed and Patient being monitored Patient Re-evaluated:Patient Re-evaluated prior to induction Oxygen Delivery Method: Nasal cannula Placement Confirmation: positive ETCO2       

## 2020-02-13 NOTE — H&P (Signed)
Lucilla Lame, MD [facility] [address] [place], [place] [address] Phone: [phone] Fax : [phone]  Primary Care Physician:  Will Bonnet, MD Primary Gastroenterologist:  Dr. Allen Norris  Pre-Procedure History & Physical: HPI:  Lori Jenkins is a 54 y.o. female is here for a screening colonoscopy.   Past Medical History:  Diagnosis Date  . Anxiety and depression   . Tendonitis     Past Surgical History:  Procedure Laterality Date  . CARPAL TUNNEL RELEASE    . CARPAL TUNNEL RELEASE    . CESAREAN SECTION    . CESAREAN SECTION WITH BILATERAL TUBAL LIGATION    . COLPOSCOPY    . DILATION AND CURETTAGE OF UTERUS    . KNEE SURGERY Left 1992  . LASIK      Prior to Admission medications   Medication Sig Start Date End Date Taking? Authorizing Provider  buPROPion (WELLBUTRIN XL) 300 MG 24 hr tablet Take 1 tablet (300 mg total) by mouth daily. 01/12/20  Yes Will Bonnet, MD  sertraline (ZOLOFT) 100 MG tablet Take 1 tablet (100 mg total) by mouth daily. 01/12/20 04/11/20 Yes Will Bonnet, MD  traZODone (DESYREL) 50 MG tablet Take 1-2 tablets at bedtime, as needed for insomnia 05/29/19  Yes Will Bonnet, MD  meloxicam (MOBIC) 15 MG tablet Take 1 tablet (15 mg total) by mouth daily as needed for pain. Patient not taking: Reported on 02/11/2020 09/06/18   Will Bonnet, MD    Allergies as of 02/11/2020 - Review Complete 02/11/2020  Allergen Reaction Noted  . Codeine Nausea Only 08/02/2016  . Shellfish allergy Nausea And Vomiting 02/11/2020    Family History  Problem Relation Age of Onset  . Breast cancer Maternal Aunt   . Heart attack Father     Social History   Socioeconomic History  . Marital status: Married    Spouse name: Not on file  . Number of children: Not on file  . Years of education: Not on file  . Highest education level: Not on file  Occupational History  . Not on file  Tobacco Use  . Smoking status: Never Smoker  . Smokeless  tobacco: Never Used  Vaping Use  . Vaping Use: Never used  Substance and Sexual Activity  . Alcohol use: Yes    Alcohol/week: 2.0 standard drinks    Types: 2 Cans of beer per week  . Drug use: No  . Sexual activity: Yes  Other Topics Concern  . Not on file  Social History Narrative  . Not on file   Social Determinants of Health   Financial Resource Strain: Not on file  Food Insecurity: Not on file  Transportation Needs: Not on file  Physical Activity: Not on file  Stress: Not on file  Social Connections: Not on file  Intimate Partner Violence: Not on file    Review of Systems: See HPI, otherwise negative ROS  Physical Exam: BP 125/83   Pulse 73   Temp 97.9 F (36.6 C) (Temporal)   Resp 18   Ht 5\' 5"  (1.651 m)   Wt 71.2 kg   LMP 06/11/2016 (Approximate)   SpO2 100%   BMI 26.13 kg/m  General:   Alert,  pleasant and cooperative in NAD Head:  Normocephalic and atraumatic. Neck:  Supple; no masses or thyromegaly. Lungs:  Clear throughout to auscultation.    Heart:  Regular rate and rhythm. Abdomen:  Soft, nontender and nondistended. Normal bowel sounds, without guarding, and  without rebound.   Neurologic:  Alert and  oriented x4;  grossly normal neurologically.  Impression/Plan: Lori Jenkins is now here to undergo a screening colonoscopy.  Risks, benefits, and alternatives regarding colonoscopy have been reviewed with the patient.  Questions have been answered.  All parties agreeable.

## 2020-02-13 NOTE — Op Note (Signed)
[facility] Gastroenterology Patient Name: Lori Jenkins Procedure Date: 02/13/2020 12:58 PM MRN: [id number] Account #: [id number] Date of Birth: [birth year] Admit Type: Outpatient Age: 54 Room: [facility] OR ROOM 01 Gender: Female Note Status: Finalized Procedure:             Colonoscopy Indications:           Screening for colorectal malignant neoplasm Providers:             Lucilla Lame MD, MD Referring MD:          Will Bonnet (Referring MD) Medicines:             Propofol per Anesthesia Complications:         No immediate complications. Procedure:             Pre-Anesthesia Assessment:                        - Prior to the procedure, a History and Physical was                         performed, and patient medications and allergies were                         reviewed. The patient's tolerance of previous                         anesthesia was also reviewed. The risks and benefits                         of the procedure and the sedation options and risks                         were discussed with the patient. All questions were                         answered, and informed consent was obtained. Prior                         Anticoagulants: The patient has taken no previous                         anticoagulant or antiplatelet agents. ASA Grade                         Assessment: II - A patient with mild systemic disease.                         After reviewing the risks and benefits, the patient                         was deemed in satisfactory condition to undergo the                         procedure.                        After obtaining informed consent, the colonoscope was  passed under direct vision. Throughout the procedure,                         the patient's blood pressure, pulse, and oxygen                         saturations were monitored continuously. The was                         introduced through the anus and  advanced to the the                         cecum, identified by appendiceal orifice and ileocecal                         valve. The colonoscopy was performed without                         difficulty. The patient tolerated the procedure well.                         The quality of the bowel preparation was excellent. Findings:      The perianal and digital rectal examinations were normal.      A 4 mm polyp was found in the descending colon. The polyp was sessile.       The polyp was removed with a cold snare. Resection and retrieval were       complete.      Non-bleeding internal hemorrhoids were found during retroflexion. The       hemorrhoids were Grade I (internal hemorrhoids that do not prolapse). Impression:            - One 4 mm polyp in the descending colon, removed with                         a cold snare. Resected and retrieved.                        - Non-bleeding internal hemorrhoids. Recommendation:        - Discharge patient to home.                        - Resume previous diet.                        - Continue present medications.                        - Await pathology results.                        - Repeat colonoscopy in 7 years if polyp adenoma and                         10 years if hyperplastic Procedure Code(s):     --- Professional ---                        [phone], Colonoscopy, flexible; with removal of  tumor(s), polyp(s), or other lesion(s) by snare                         technique Diagnosis Code(s):     --- Professional ---                        Z12.11, Encounter for screening for malignant neoplasm                         of colon                        K63.5, Polyp of colon CPT copyright 2019 American Medical Association. All rights reserved. The codes documented in this report are preliminary and upon coder review may  be revised to meet current compliance requirements. Lucilla Lame MD, MD 02/13/2020 1:20:52 PM This report  has been signed electronically. Number of Addenda: 0 Note Initiated On: 02/13/2020 12:58 PM Scope Withdrawal Time: 0 hours 8 minutes 40 seconds  Total Procedure Duration: 0 hours 12 minutes 37 seconds  Estimated Blood Loss:  Estimated blood loss: none.      [facility]

## 2020-02-16 ENCOUNTER — Encounter: Payer: Self-pay | Admitting: Gastroenterology

## 2020-02-17 LAB — SURGICAL PATHOLOGY

## 2020-02-23 ENCOUNTER — Encounter: Payer: Self-pay | Admitting: Gastroenterology

## 2020-04-20 ENCOUNTER — Ambulatory Visit: Payer: BC Managed Care – PPO | Admitting: Dermatology

## 2020-04-20 ENCOUNTER — Other Ambulatory Visit: Payer: Self-pay

## 2020-04-20 DIAGNOSIS — L900 Lichen sclerosus et atrophicus: Secondary | ICD-10-CM

## 2020-04-20 DIAGNOSIS — L7211 Pilar cyst: Secondary | ICD-10-CM | POA: Diagnosis not present

## 2020-04-20 DIAGNOSIS — L820 Inflamed seborrheic keratosis: Secondary | ICD-10-CM | POA: Diagnosis not present

## 2020-04-20 DIAGNOSIS — L821 Other seborrheic keratosis: Secondary | ICD-10-CM | POA: Diagnosis not present

## 2020-04-20 DIAGNOSIS — D489 Neoplasm of uncertain behavior, unspecified: Secondary | ICD-10-CM

## 2020-04-20 NOTE — Patient Instructions (Addendum)
Cryotherapy Aftercare  . Wash gently with soap and water everyday.   Marland Kitchen Apply Vaseline and Band-Aid daily until healed.  Discussed cosmetic procedure, noncovered.  $60 for 1st lesion and $15 for each additional lesion if done on the same day.  Maximum charge $350.  One touch-up treatment included no charge. Discussed risks of treatment including dyspigmentation, small scar, and/or recurrence. Recommend daily broad spectrum sunscreen SPF 30+/photoprotection to treated areas once healed.   Wound Care Instructions  1. Cleanse wound gently with soap and water once a day then pat dry with clean gauze. Apply a thing coat of Petrolatum (petroleum jelly, "Vaseline") over the wound (unless you have an allergy to this). We recommend that you use a new, sterile tube of Vaseline. Do not pick or remove scabs. Do not remove the yellow or white "healing tissue" from the base of the wound.  2. Cover the wound with fresh, clean, nonstick gauze and secure with paper tape. You may use Band-Aids in place of gauze and tape if the would is small enough, but would recommend trimming much of the tape off as there is often too much. Sometimes Band-Aids can irritate the skin.  3. You should call the office for your biopsy report after 1 week if you have not already been contacted.  4. If you experience any problems, such as abnormal amounts of bleeding, swelling, significant bruising, significant pain, or evidence of infection, please call the office immediately.  5. FOR ADULT SURGERY PATIENTS: If you need something for pain relief you may take 1 extra strength Tylenol (acetaminophen) AND 2 Ibuprofen (200mg  each) together every 4 hours as needed for pain. (do not take these if you are allergic to them or if you have a reason you should not take them.) Typically, you may only need pain medication for 1 to 3 days.

## 2020-04-20 NOTE — Progress Notes (Signed)
Follow-Up Visit   Subjective  Lori Jenkins is a 55 y.o. female who presents for the following: spots to check.  Patient here today for a spot on her chest. She noticed it several weeks ago and bled about a week ago after it was scraped. She also has a rough spot on her foot that's a little sore and growing. It has been there for a year, and was bothersome when she would run. She has a cyst on her scalp that was removed years ago, but has come back.  The following portions of the chart were reviewed this encounter and updated as appropriate:       Review of Systems:  No other skin or systemic complaints except as noted in HPI or Assessment and Plan.  Objective  Well appearing patient in no apparent distress; mood and affect are within normal limits.  A focused examination was performed including face, scalp, chest, foot. Relevant physical exam findings are noted in the Assessment and Plan.  Objective  Right Chest x 1: 4.81mm pink brown papule with adjacent light brown macule  Objective  R plantar foot: Pink white scaly patch with erythematous rim, 1.2cm     Objective  Right Parietal Scalp: 1.2cm firm sq nodule  Objective  Left Temple x 2 (2): Stuck-on, waxy, tan-brown papule or plaque --Discussed benign etiology and prognosis.   Images     Assessment & Plan  Inflamed seborrheic keratosis Right Chest x 1  Recheck on f/up Pt will call if doesn't clear  Destruction of lesion - Right Chest x 1  Destruction method: cryotherapy   Informed consent: discussed and consent obtained   Lesion destroyed using liquid nitrogen: Yes   Region frozen until ice ball extended beyond lesion: Yes   Outcome: patient tolerated procedure well with no complications   Post-procedure details: wound care instructions given    Neoplasm of uncertain behavior R plantar foot  Skin / nail biopsy Type of biopsy: tangential   Informed consent: discussed and consent obtained   Patient  was prepped and draped in usual sterile fashion: Area prepped with alcohol. Anesthesia: the lesion was anesthetized in a standard fashion   Anesthetic:  1% lidocaine w/ epinephrine 1-100,000 buffered w/ 8.4% NaHCO3 Instrument used: flexible razor blade   Hemostasis achieved with: pressure, aluminum chloride and electrodesiccation   Outcome: patient tolerated procedure well   Post-procedure details: wound care instructions given   Post-procedure details comment:  Ointment and small bandage applied  Specimen 1 - Surgical pathology Differential Diagnosis: Porokeratosis r/o SCC in situ Check Margins: No Pink white scaly patch with erythematous rim, 1.2cm  Porokeratosis r/o SCC in situ  Pilar cyst Right Parietal Scalp  Benign-appearing. Exam most consistent with an pilar cyst. Discussed that a cyst is a benign growth that can grow over time and sometimes get irritated or inflamed. Recommend observation if it is not bothersome. Discussed option of surgical excision to remove it if it is growing, symptomatic, or other changes noted. Please call for new or changing lesions so they can be evaluated.    Seborrheic keratosis (2) Left Temple x 2  Discussed cosmetic procedure, noncovered.  $60 for 1st lesion and $15 for each additional lesion if done on the same day.  Maximum charge $350.  One touch-up treatment included no charge. Discussed risks of treatment including dyspigmentation, small scar, and/or recurrence. Recommend daily broad spectrum sunscreen SPF 30+/photoprotection to treated areas once healed.   LN2 x 2 today, left temple  Destruction of lesion - Left Temple x 2  Destruction method: cryotherapy   Informed consent: discussed and consent obtained   Lesion destroyed using liquid nitrogen: Yes   Region frozen until ice ball extended beyond lesion: Yes   Outcome: patient tolerated procedure well with no complications   Post-procedure details: wound care instructions given     Return pending biopsy results.Lindi Adie, CMA, am acting as scribe for Brendolyn Patty, MD .  Documentation: I have reviewed the above documentation for accuracy and completeness, and I agree with the above.  Brendolyn Patty MD

## 2020-04-26 ENCOUNTER — Other Ambulatory Visit: Payer: Self-pay

## 2020-04-26 ENCOUNTER — Telehealth: Payer: Self-pay

## 2020-04-26 MED ORDER — CLOBETASOL PROPIONATE 0.05 % EX CREA: TOPICAL_CREAM | CUTANEOUS | 1 refills | Status: AC

## 2020-04-26 NOTE — Telephone Encounter (Signed)
Advised patient of biopsy results. Once site healed, she will start clobetasol cream qd/bid until improved dsp 30g 1Rf, Avoid face, groin, axilla. Rx sent to [facility].

## 2020-04-26 NOTE — Telephone Encounter (Signed)
-----   Message from Brendolyn Patty, MD sent at 04/26/2020  1:06 PM EST ----- Skin , right plantar foot LICHEN SCLEROSUS  Benign inflammatory rash.  Does she have other similar spots on body?  Can also affect the genital area and cause pain/itching.  Once biopsy site healed, treat with Clobetasol ointment 15 gm, 1-2x daily to aa until rash clear, 1 rf.  Do not apply to face or body folds.

## 2020-06-21 ENCOUNTER — Other Ambulatory Visit: Payer: Self-pay

## 2020-06-21 ENCOUNTER — Telehealth: Payer: Self-pay

## 2020-06-21 ENCOUNTER — Encounter: Payer: BC Managed Care – PPO | Admitting: Dermatology

## 2020-06-21 IMAGING — MG DIGITAL SCREENING BILATERAL MAMMOGRAM WITH TOMO AND CAD
8 series · 8 of 24 positions shown · non-contrast
Comparison: Previous exam(s).

CLINICAL DATA: Screening.

EXAM:
DIGITAL SCREENING BILATERAL MAMMOGRAM WITH TOMO AND CAD

[R MLO synth-2D]
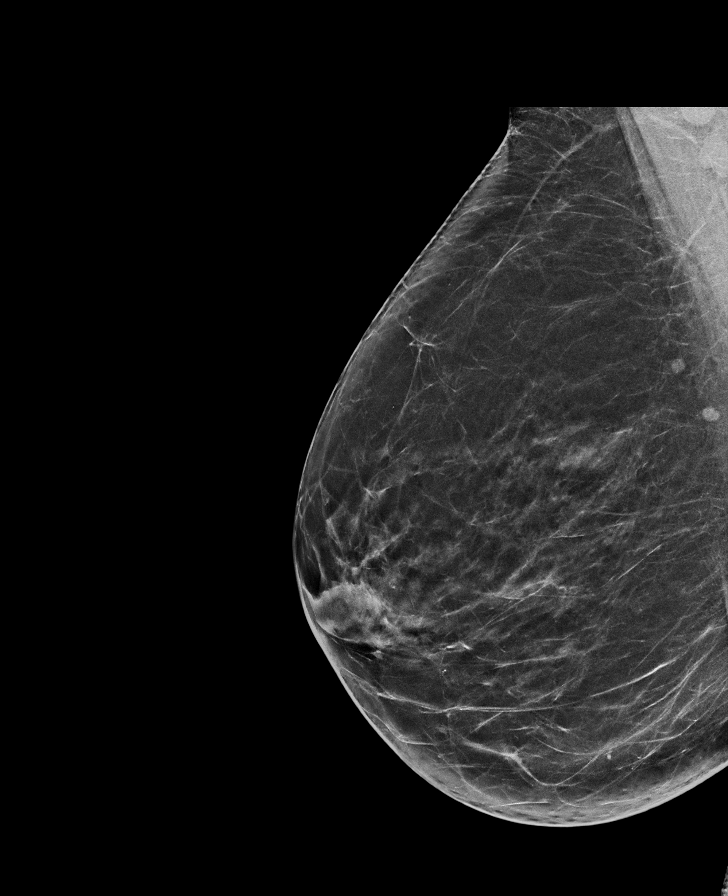

[L MLO synth-2D]
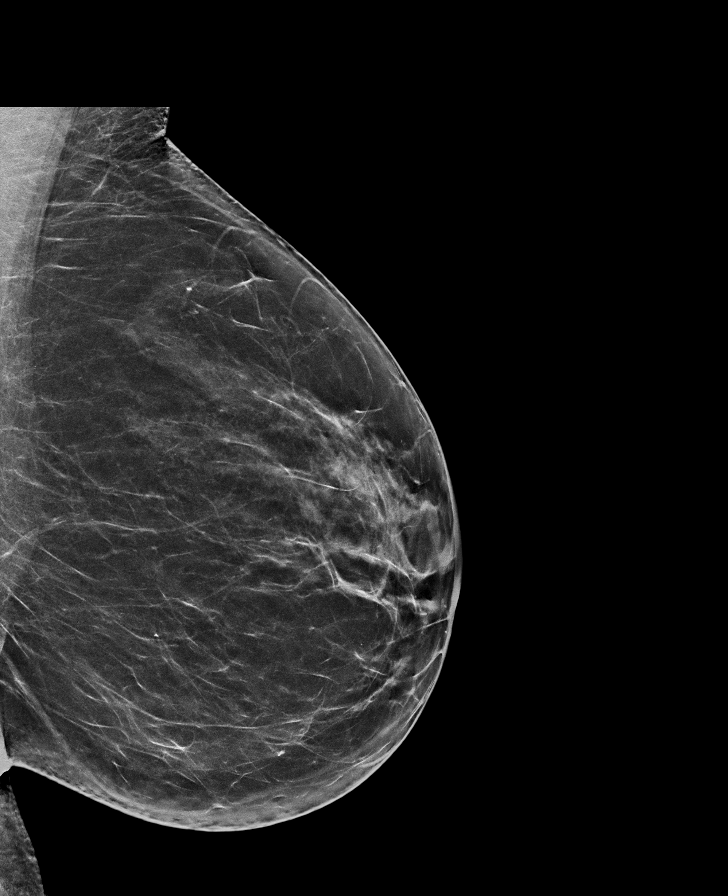

[L CC synth-2D]
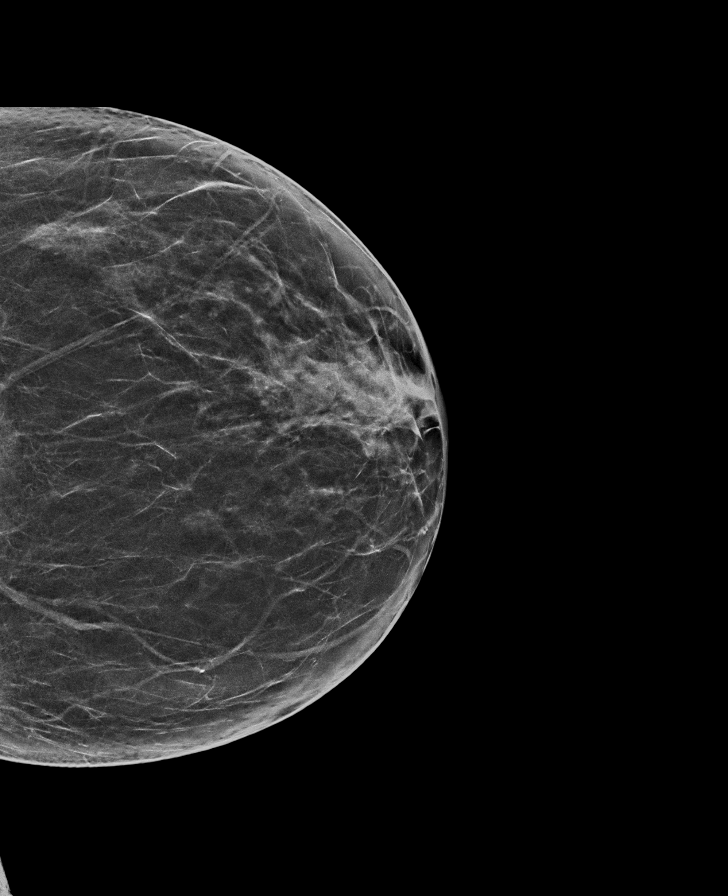

[R CC synth-2D]
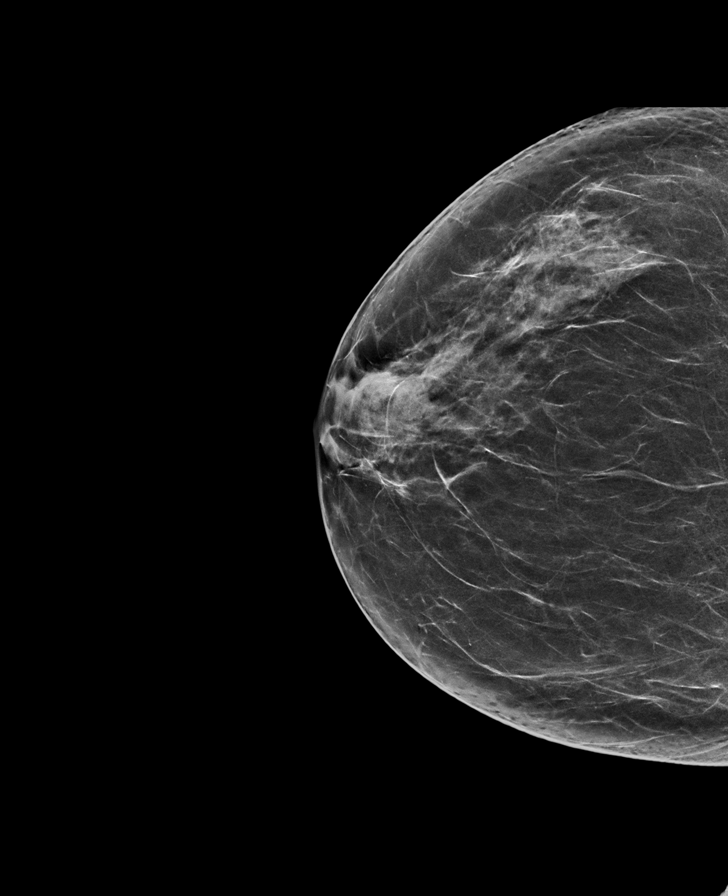

[R MLO tomo · tomo slice 37/72.0]
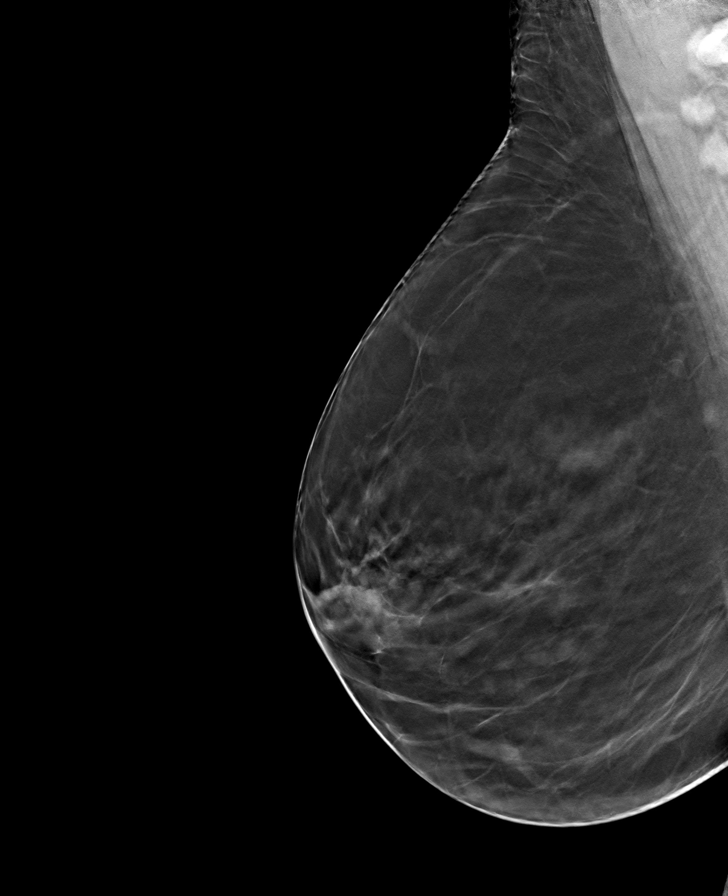

[L MLO tomo · tomo slice 35/70.0]
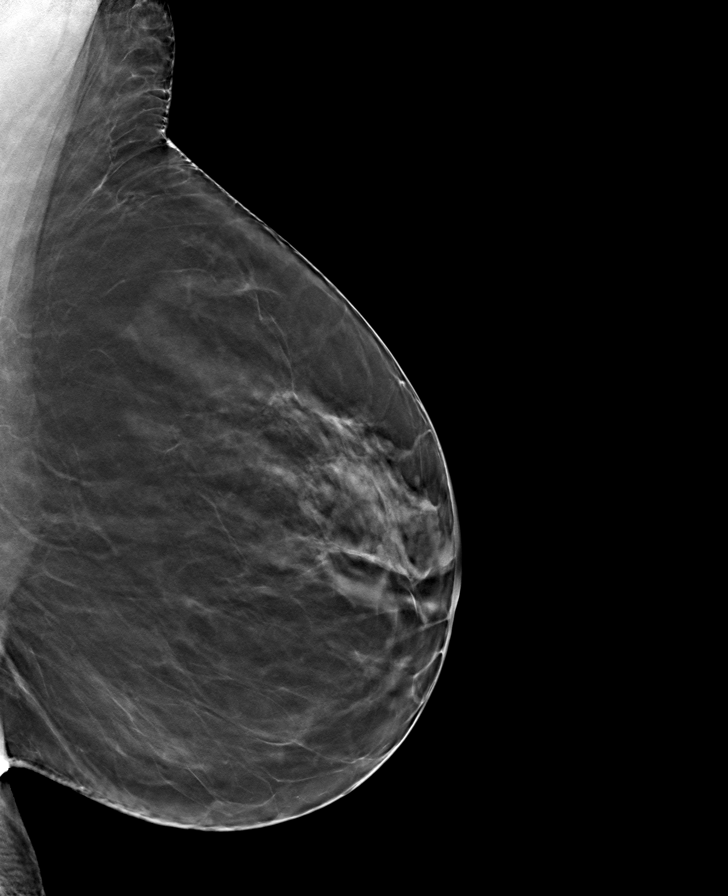

[R CC tomo · tomo slice 35/70.0]
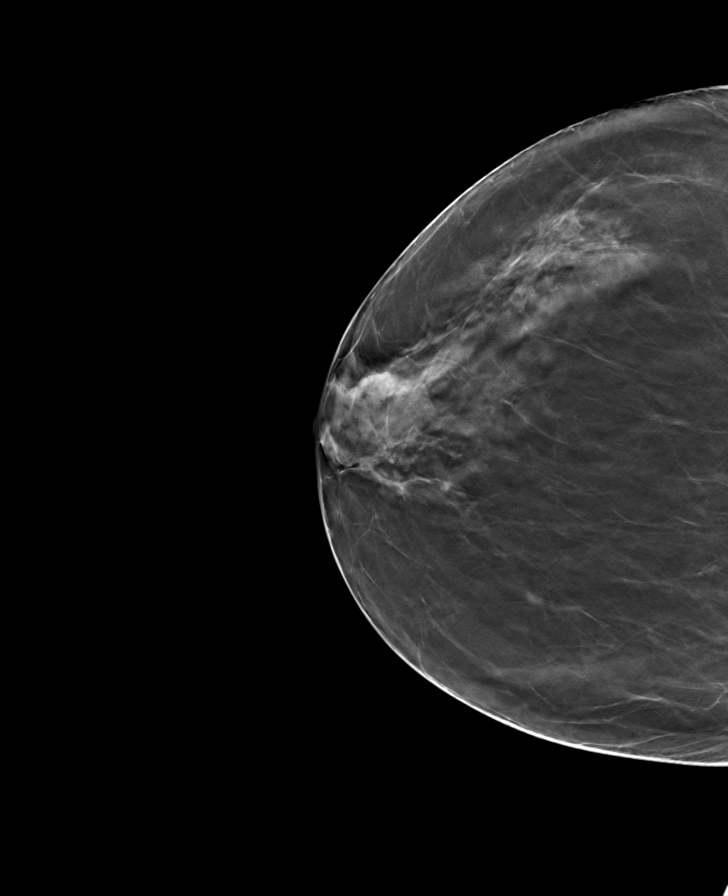

[L CC tomo · tomo slice 34/67.0]
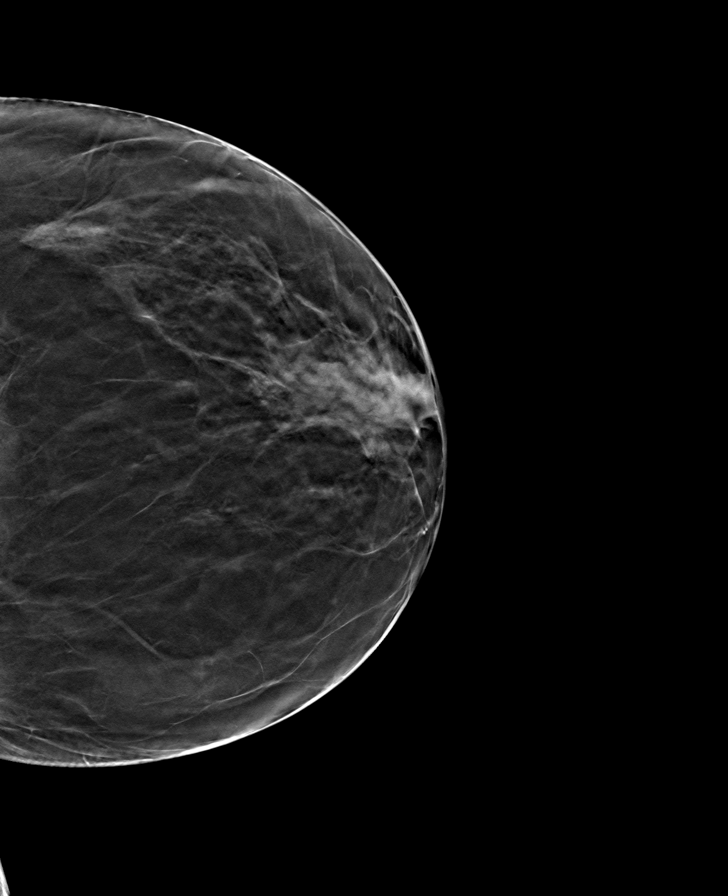

[8 of 24 positions shown; findings below may reference images not displayed]

ACR Breast Density Category b: There are scattered areas of
fibroglandular density.
FINDINGS: There are no findings suspicious for malignancy. Images were
processed with CAD.
IMPRESSION: No mammographic evidence of malignancy. A result letter of this
screening mammogram will be mailed directly to the patient.

RECOMMENDATION:
Screening mammogram in one year. (Code:[id number])

BI-RADS CATEGORY  1: Negative.

## 2020-06-21 NOTE — Progress Notes (Signed)
Pt left. 

## 2020-06-21 NOTE — Telephone Encounter (Signed)
Left message for patient to return my call. Patient left today due to a long wait. Patient rescheduled cyst surgery for 08/23/2020 at 1:30pm. If patient would like a sooner appointment, we could see her 07/07/2020 at 12:30pm.

## 2020-06-21 NOTE — Patient Instructions (Signed)

## 2020-08-10 NOTE — Progress Notes (Signed)
This encounter was created in error - please disregard.

## 2020-08-23 ENCOUNTER — Ambulatory Visit: Payer: BC Managed Care – PPO | Admitting: Dermatology

## 2020-08-23 ENCOUNTER — Other Ambulatory Visit: Payer: Self-pay

## 2020-08-23 DIAGNOSIS — L7211 Pilar cyst: Secondary | ICD-10-CM

## 2020-08-23 DIAGNOSIS — D485 Neoplasm of uncertain behavior of skin: Secondary | ICD-10-CM

## 2020-08-23 NOTE — Patient Instructions (Signed)
Wound Care Instructions  Cleanse wound gently with soap and water once a day then pat dry with clean gauze. Apply a thing coat of Petrolatum (petroleum jelly, "Vaseline") over the wound (unless you have an allergy to this). We recommend that you use a new, sterile tube of Vaseline. Do not pick or remove scabs. Do not remove the yellow or white "healing tissue" from the base of the wound.  Cover the wound with fresh, clean, nonstick gauze and secure with paper tape. You may use Band-Aids in place of gauze and tape if the would is small enough, but would recommend trimming much of the tape off as there is often too much. Sometimes Band-Aids can irritate the skin.  You should call the office for your biopsy report after 1 week if you have not already been contacted.  If you experience any problems, such as abnormal amounts of bleeding, swelling, significant bruising, significant pain, or evidence of infection, please call the office immediately.  FOR ADULT SURGERY PATIENTS: If you need something for pain relief you may take 1 extra strength Tylenol (acetaminophen) AND 2 Ibuprofen (200mg each) together every 4 hours as needed for pain. (do not take these if you are allergic to them or if you have a reason you should not take them.) Typically, you may only need pain medication for 1 to 3 days.   If you have any questions or concerns for your doctor, please call our main line at [phone] and press option 4 to reach your doctor's medical assistant. If no one answers, please leave a voicemail as directed and we will return your call as soon as possible. Messages left after 4 pm will be answered the following business day.   You may also send us a message via MyChart. We typically respond to MyChart messages within 1-2 business days.  For prescription refills, please ask your pharmacy to contact our office. Our fax number is [phone].  If you have an urgent issue when the clinic is closed that  cannot wait until the next business day, you can page your doctor at the number below.    Please note that while we do our best to be available for urgent issues outside of office hours, we are not available 24/7.   If you have an urgent issue and are unable to reach us, you may choose to seek medical care at your doctor's office, retail clinic, urgent care center, or emergency room.  If you have a medical emergency, please immediately call 911 or go to the emergency department.  Pager Numbers  - Dr. Kowalski: [phone]  - Dr. Moye: [phone]  - Dr. Stewart: [phone]  In the event of inclement weather, please call our main line at [phone] for an update on the status of any delays or closures.  Dermatology Medication Tips: Please keep the boxes that topical medications come in in order to help keep track of the instructions about where and how to use these. Pharmacies typically print the medication instructions only on the boxes and not directly on the medication tubes.   If your medication is too expensive, please contact our office at [phone] option 4 or send us a message through MyChart.   We are unable to tell what your co-pay for medications will be in advance as this is different depending on your insurance coverage. However, we may be able to find a substitute medication at lower cost or fill out paperwork to get insurance to cover a needed   medication.   If a prior authorization is required to get your medication covered by your insurance company, please allow us 1-2 business days to complete this process.  Drug prices often vary depending on where the prescription is filled and some pharmacies may offer cheaper prices.  The website www.goodrx.com contains coupons for medications through different pharmacies. The prices here do not account for what the cost may be with help from insurance (it may be cheaper with your insurance), but the website can give you the  price if you did not use any insurance.  - You can print the associated coupon and take it with your prescription to the pharmacy.  - You may also stop by our office during regular business hours and pick up a GoodRx coupon card.  - If you need your prescription sent electronically to a different pharmacy, notify our office through [facility] MyChart or by phone at [phone] option 4.   

## 2020-08-23 NOTE — Progress Notes (Signed)
   Follow-Up Visit   Subjective  Lori Jenkins is a 55 y.o. female who presents for the following: Cyst (R parietal scalp. Patient presents for excision.). Symptomatic. Was removed in past and has come back.    The following portions of the chart were reviewed this encounter and updated as appropriate:        Review of Systems:  No other skin or systemic complaints except as noted in HPI or Assessment and Plan.  Objective  Well appearing patient in no apparent distress; mood and affect are within normal limits.  A focused examination was performed including scalp. Relevant physical exam findings are noted in the Assessment and Plan.  Right Parietal Scalp Firm subcutaneous nodule 1.4 x 1.2 cm   Assessment & Plan  Neoplasm of uncertain behavior of skin Right Parietal Scalp  Skin excision  Lesion length (cm):  1.4 Lesion width (cm):  1.2 Margin per side (cm):  0 Total excision diameter (cm):  1.4 Informed consent: discussed and consent obtained   Timeout: patient name, date of birth, surgical site, and procedure verified   Procedure prep:  Patient was prepped and draped in usual sterile fashion Prep type:  Povidone-iodine Anesthesia: the lesion was anesthetized in a standard fashion   Anesthesia comment:  Total 9cc - 6cc 1% lido w/epi, 3cc  0.5% bupivicaine Anesthetic:  1% lidocaine w/ epinephrine 1-100,000 buffered w/ 8.4% NaHCO3 (0.5% bupivicaine) Instrument used comment:  #15c blade Hemostasis achieved with: pressure   Outcome: patient tolerated procedure well with no complications    Skin repair Complexity:  Intermediate Final length (cm):  1 Informed consent: discussed and consent obtained   Reason for type of repair: reduce tension to allow closure, reduce the risk of dehiscence, infection, and necrosis and reduce subcutaneous dead space and avoid a hematoma   Undermining: edges undermined   Subcutaneous layers (deep stitches):  Suture size:  4-0 Suture type:  Vicryl (polyglactin 910)   Stitches:  Buried vertical mattress Fine/surface layer approximation (top stitches):  Suture size:  3-0 Suture type: Prolene (polypropylene)   Stitches: simple interrupted   Suture removal (days):  7 Hemostasis achieved with: suture Outcome: patient tolerated procedure well with no complications   Post-procedure details: wound care instructions given   Dressing: mupirocin.   Additional details:  Mupirocin ointment applied    Specimen 1 - Surgical pathology Differential Diagnosis: Cyst vs other Check Margins: No Firm subcutaneous nodule 1.4 x 1.2 cm  Return in about 1 week (around 08/30/2020) for suture removal.  I, Jamesetta Orleans, CMA, am acting as scribe for Brendolyn Patty, MD .  Documentation: I have reviewed the above documentation for accuracy and completeness, and I agree with the above.  Brendolyn Patty MD

## 2020-08-24 ENCOUNTER — Telehealth: Payer: Self-pay

## 2020-08-24 NOTE — Telephone Encounter (Signed)
Talked to patient and she is doing fine from surgery yesterday.  

## 2020-08-31 ENCOUNTER — Ambulatory Visit: Payer: BC Managed Care – PPO

## 2020-08-31 ENCOUNTER — Other Ambulatory Visit: Payer: Self-pay

## 2020-08-31 DIAGNOSIS — Z4802 Encounter for removal of sutures: Secondary | ICD-10-CM

## 2020-08-31 NOTE — Patient Instructions (Signed)

## 2020-08-31 NOTE — Progress Notes (Signed)
   Follow-Up Visit   Subjective  Lori Jenkins is a 55 y.o. female who presents for the following: Suture / Staple Removal (Suture removal for excision for biopsy proven benign cyst. ).    The following portions of the chart were reviewed this encounter and updated as appropriate:         Objective  Well appearing patient in no apparent distress; mood and affect are within normal limits.    right parietal Scalp Incision site is clean, dry and intact    Assessment & Plan  Encounter for removal of sutures right parietal Scalp  Encounter for Removal of Sutures - Incision site at the right parietal scalp is clean, dry and intact - Wound cleansed, sutures removed, wound cleansed and steri strips applied.  - Discussed pathology results showing benign cyst  - Patient advised to keep steri-strips dry until they fall off. - Scars remodel for a full year. - Once steri-strips fall off, patient can apply over-the-counter silicone scar cream each night to help with scar remodeling if desired. - Patient advised to call with any concerns or if they notice any new or changing lesions.    No follow-ups on file.  I, Harriett Sine, CMA, am acting as scribe for [employer], CMA.

## 2021-01-06 ENCOUNTER — Ambulatory Visit
Admission: EM | Admit: 2021-01-06 | Discharge: 2021-01-06 | Disposition: A | Discharge status: Home / Self Care | Payer: BC Managed Care – PPO | Attending: Emergency Medicine | Admitting: Emergency Medicine

## 2021-01-06 ENCOUNTER — Encounter: Payer: Self-pay | Admitting: Emergency Medicine

## 2021-01-06 ENCOUNTER — Other Ambulatory Visit: Payer: Self-pay

## 2021-01-06 DIAGNOSIS — R21 Rash and other nonspecific skin eruption: Secondary | ICD-10-CM

## 2021-01-06 DIAGNOSIS — M545 Low back pain, unspecified: Secondary | ICD-10-CM

## 2021-01-06 MED ORDER — PREDNISONE 10 MG (21) PO TBPK: ORAL_TABLET | Freq: Every day | ORAL | 0 refills | Status: AC

## 2021-01-06 NOTE — Discharge Instructions (Signed)
The cause of your rash today is unknown therefore if it does not improve with the use of steroid course please follow-up with your primary care doctor and your dermatologist for further evaluation  Take prednisone every morning with food as prescribed on the package, while using this medication please avoid use of ibuprofen, Aleve, Advil, naproxen do not irritate your kidneys and stomach  Tylenol if needed for additional comfort while on steroids

## 2021-01-06 NOTE — ED Triage Notes (Signed)
Pt presents today with c/o rash, redness to face x 2 weeks. She also c/o of right lower back pain x 2.5 weeks.

## 2021-01-06 NOTE — ED Provider Notes (Signed)
[facility]    CSN: [id number] Arrival date & time: 01/06/21  0858      History   Chief Complaint Chief Complaint  Patient presents with   Rash   Facial Swelling    HPI Lori Jenkins is a 55 y.o. female.   Patient presents with a red and flushed rash centralized to her face for 2 weeks.  Endorses face feels swollen and warm.  Symptoms began abruptly rashes never occurred before.  Denies changes in soaps, lotions, fragrances, diet, fever, chills, drainage.  Denies recent travel.  Has not attempted to treat.  History of seborrheic keratosis.  Patient concerned with discomfort in the right lower back that began abruptly after a long period of sitting.  Has been present for greater for 2 to 3 weeks.  Range of motion is intact.  Denies numbness, tingling, prior injury or trauma, urinary or bowel changes.  Has not attempted treatment.  Past Medical History:  Diagnosis Date   Anxiety and depression    Tendonitis     Patient Active Problem List   Diagnosis Date Noted   Encounter for screening colonoscopy    Polyp of descending colon    Anxiety 01/19/2020   ADD (attention deficit disorder) 01/19/2020   Insomnia 11/07/2017   Anxiety and depression     Past Surgical History:  Procedure Laterality Date   CARPAL TUNNEL RELEASE     CARPAL TUNNEL RELEASE     CESAREAN SECTION     CESAREAN SECTION WITH BILATERAL TUBAL LIGATION     COLONOSCOPY WITH PROPOFOL N/A 02/13/2020   Procedure: COLONOSCOPY WITH PROPOFOL;  Surgeon: Lucilla Lame, MD;  Location: [facility];  Service: Endoscopy;  Laterality: N/A;  priority 4    COLPOSCOPY     DILATION AND CURETTAGE OF UTERUS     KNEE SURGERY Left 1992   LASIK     POLYPECTOMY  02/13/2020   Procedure: POLYPECTOMY;  Surgeon: Lucilla Lame, MD;  Location: [facility];  Service: Endoscopy;;    OB History     Gravida  3   Para  2   Term  2   Preterm      AB  1   Living  2      SAB  1   IAB       Ectopic      Multiple      Live Births  2            Home Medications    Prior to Admission medications   Medication Sig Start Date End Date Taking? Authorizing Provider  predniSONE (STERAPRED UNI-PAK 21 TAB) 10 MG (21) TBPK tablet Take by mouth daily. Take 6 tabs by mouth daily  for 2 days, then 5 tabs for 2 days, then 4 tabs for 2 days, then 3 tabs for 2 days, 2 tabs for 2 days, then 1 tab by mouth daily for 2 days 01/06/21  Yes Dellene Mcgroarty R, NP  buPROPion (WELLBUTRIN XL) 300 MG 24 hr tablet Take 1 tablet (300 mg total) by mouth daily. 01/12/20   Will Bonnet, MD  clobetasol cream (TEMOVATE) 0.05 % Apply to affected area foot 1-2 times a day until improved. Avoid face, groin, underarms. 04/26/20   Brendolyn Patty, MD  sertraline (ZOLOFT) 100 MG tablet Take 1 tablet (100 mg total) by mouth daily. 01/12/20 04/11/20  Will Bonnet, MD    Family History Family History  Problem Relation Age of Onset  Breast cancer Maternal Aunt    Heart attack Father     Social History Social History   Tobacco Use   Smoking status: Never   Smokeless tobacco: Never  Vaping Use   Vaping Use: Never used  Substance Use Topics   Alcohol use: Yes    Alcohol/week: 2.0 standard drinks    Types: 2 Cans of beer per week   Drug use: No     Allergies   Codeine and Shellfish allergy   Review of Systems Review of Systems  Constitutional: Negative.   Respiratory: Negative.    Cardiovascular: Negative.   Musculoskeletal:  Positive for back pain. Negative for arthralgias, gait problem, joint swelling, myalgias, neck pain and neck stiffness.  Skin:  Positive for rash. Negative for color change, pallor and wound.  Neurological: Negative.     Physical Exam Triage Vital Signs ED Triage Vitals  Enc Vitals Group     BP 01/06/21 0938 126/90     Pulse Rate 01/06/21 0938 (!) 105     Resp 01/06/21 0938 16     Temp 01/06/21 0938 98.9 F (37.2 C)     Temp Source 01/06/21 0938 Oral      SpO2 01/06/21 0938 97 %     Weight --      Height --      Head Circumference --      Peak Flow --      Pain Score 01/06/21 0936 4     Pain Loc --      Pain Edu? --      Excl. in [place]? --    No data found.  Updated Vital Signs BP 126/90 (BP Location: Right Arm)   Pulse (!) 105   Temp 98.9 F (37.2 C) (Oral)   Resp 16   LMP 06/11/2016 (Approximate)   SpO2 97%   Visual Acuity Right Eye Distance:   Left Eye Distance:   Bilateral Distance:    Right Eye Near:   Left Eye Near:    Bilateral Near:     Physical Exam Constitutional:      Appearance: Normal appearance. She is normal weight.  HENT:     Head: Normocephalic.      Comments: Macular erythematous rash localized to the bilateral cheeks and bridge of nose Eyes:     Extraocular Movements: Extraocular movements intact.  Pulmonary:     Effort: Pulmonary effort is normal.     Breath sounds: Normal breath sounds.  Musculoskeletal:     Comments: Diffuse tenderness along the right latissimus dorsi, no swelling, deformity, erythema, spasms noted range of motion is intact  Neurological:     Mental Status: She is alert and oriented to person, place, and time. Mental status is at baseline.  Psychiatric:        Mood and Affect: Mood normal.        Behavior: Behavior normal.     UC Treatments / Results  Labs (all labs ordered are listed, but only abnormal results are displayed) Labs Reviewed - No data to display  EKG   Radiology No results found.  Procedures Procedures (including critical care time)  Medications Ordered in UC Medications - No data to display  Initial Impression / Assessment and Plan / UC Course  I have reviewed the triage vital signs and the nursing notes.  Pertinent labs & imaging results that were available during my care of the patient were reviewed by me and considered in my medical decision making (see chart  for details).  Rash Acute right-sided low back pain without sciatica  Unknown  etiology of rash, recommended follow-up with dermatology and primary care doctor for further evaluation if rash is persistent we will trial prednisone taper which will also help with back pain, patient in agreement with plan, advised pillows for support, daily stretching and heating pad in 15 intervals in addition for back pain  1.  Prednisone 60 mg taper daily Final Clinical Impressions(s) / UC Diagnoses   Final diagnoses:  Acute right-sided low back pain without sciatica  Rash and nonspecific skin eruption     Discharge Instructions      The cause of your rash today is unknown therefore if it does not improve with the use of steroid course please follow-up with your primary care doctor and your dermatologist for further evaluation  Take prednisone every morning with food as prescribed on the package, while using this medication please avoid use of ibuprofen, Aleve, Advil, naproxen do not irritate your kidneys and stomach  Tylenol if needed for additional comfort while on steroids   ED Prescriptions     Medication Sig Dispense Auth. Provider   predniSONE (STERAPRED UNI-PAK 21 TAB) 10 MG (21) TBPK tablet Take by mouth daily. Take 6 tabs by mouth daily  for 2 days, then 5 tabs for 2 days, then 4 tabs for 2 days, then 3 tabs for 2 days, 2 tabs for 2 days, then 1 tab by mouth daily for 2 days 42 tablet Undrea Archbold, Leitha Schuller, NP      PDMP not reviewed this encounter.   Hans Eden, NP 01/06/21 2006

## 2021-01-06 NOTE — ED Notes (Signed)
Called patient in from parking lot.

## 2021-01-10 ENCOUNTER — Other Ambulatory Visit: Payer: Self-pay | Admitting: Obstetrics and Gynecology

## 2021-01-10 DIAGNOSIS — Z1231 Encounter for screening mammogram for malignant neoplasm of breast: Secondary | ICD-10-CM

## 2021-02-11 ENCOUNTER — Other Ambulatory Visit: Payer: Self-pay | Admitting: Family Medicine

## 2021-02-11 DIAGNOSIS — Z1231 Encounter for screening mammogram for malignant neoplasm of breast: Secondary | ICD-10-CM

## 2021-02-15 ENCOUNTER — Other Ambulatory Visit: Payer: Self-pay

## 2021-02-15 ENCOUNTER — Ambulatory Visit
Admission: RE | Admit: 2021-02-15 | Discharge: 2021-02-15 | Disposition: A | Discharge status: Home / Self Care | Payer: BC Managed Care – PPO | Source: Ambulatory Visit | Attending: Family Medicine | Admitting: Family Medicine

## 2021-02-15 DIAGNOSIS — Z1231 Encounter for screening mammogram for malignant neoplasm of breast: Secondary | ICD-10-CM | POA: Insufficient documentation

## 2021-02-22 ENCOUNTER — Other Ambulatory Visit: Payer: Self-pay | Admitting: Family Medicine

## 2021-02-22 DIAGNOSIS — R1011 Right upper quadrant pain: Secondary | ICD-10-CM

## 2021-02-22 DIAGNOSIS — R768 Other specified abnormal immunological findings in serum: Secondary | ICD-10-CM

## 2021-11-02 ENCOUNTER — Other Ambulatory Visit: Payer: Self-pay | Admitting: Orthopedic Surgery

## 2021-11-02 DIAGNOSIS — G8929 Other chronic pain: Secondary | ICD-10-CM

## 2021-11-02 DIAGNOSIS — M2391 Unspecified internal derangement of right knee: Secondary | ICD-10-CM

## 2021-11-02 DIAGNOSIS — M1711 Unilateral primary osteoarthritis, right knee: Secondary | ICD-10-CM

## 2021-11-16 ENCOUNTER — Other Ambulatory Visit: Payer: BC Managed Care – PPO

## 2022-02-21 ENCOUNTER — Other Ambulatory Visit: Payer: Self-pay | Admitting: Family Medicine

## 2022-02-21 ENCOUNTER — Ambulatory Visit
Admission: RE | Admit: 2022-02-21 | Discharge: 2022-02-21 | Disposition: A | Discharge status: Home / Self Care | Payer: BC Managed Care – PPO | Source: Ambulatory Visit | Attending: Family Medicine | Admitting: Family Medicine

## 2022-02-21 DIAGNOSIS — Z1231 Encounter for screening mammogram for malignant neoplasm of breast: Secondary | ICD-10-CM | POA: Insufficient documentation

## 2022-03-22 ENCOUNTER — Other Ambulatory Visit: Payer: Self-pay | Admitting: Orthopedic Surgery

## 2022-03-22 DIAGNOSIS — M1711 Unilateral primary osteoarthritis, right knee: Secondary | ICD-10-CM

## 2022-03-22 DIAGNOSIS — M2391 Unspecified internal derangement of right knee: Secondary | ICD-10-CM

## 2022-03-22 DIAGNOSIS — G8929 Other chronic pain: Secondary | ICD-10-CM

## 2022-03-28 ENCOUNTER — Ambulatory Visit
Admission: RE | Admit: 2022-03-28 | Discharge: 2022-03-28 | Disposition: A | Discharge status: Home / Self Care | Payer: BC Managed Care – PPO | Source: Ambulatory Visit | Attending: Orthopedic Surgery | Admitting: Orthopedic Surgery

## 2022-03-28 DIAGNOSIS — M2391 Unspecified internal derangement of right knee: Secondary | ICD-10-CM

## 2022-03-28 DIAGNOSIS — G8929 Other chronic pain: Secondary | ICD-10-CM

## 2022-03-28 DIAGNOSIS — M1711 Unilateral primary osteoarthritis, right knee: Secondary | ICD-10-CM

## 2022-06-14 ENCOUNTER — Encounter: Payer: Self-pay | Admitting: Dermatology

## 2022-06-14 ENCOUNTER — Ambulatory Visit (INDEPENDENT_AMBULATORY_CARE_PROVIDER_SITE_OTHER): Payer: BC Managed Care – PPO | Admitting: Dermatology

## 2022-06-14 VITALS — BP 106/72 | HR 75

## 2022-06-14 DIAGNOSIS — L7211 Pilar cyst: Secondary | ICD-10-CM

## 2022-06-14 NOTE — Progress Notes (Signed)
   Follow-Up Visit   Subjective  Lori Jenkins is a 57 y.o. female who presents for the following: Cyst on scalp. Patient states she scheduled this appointment today for removal of the cyst. Scheduled  through [facility] online.    The following portions of the chart were reviewed this encounter and updated as appropriate: medications, allergies, medical history  Review of Systems:  No other skin or systemic complaints except as noted in HPI or Assessment and Plan.  Objective  Well appearing patient in no apparent distress; mood and affect are within normal limits.  A focused examination was performed of the following areas: Scalp  Relevant exam findings are noted in the Assessment and Plan.    Assessment & Plan   Pilar Cyst Exam: 1.0 cm firm subcutaneous nodule at right parietal scalp.  Benign-appearing. Exam most consistent with a pilar cyst. Discussed that a cyst is a benign growth that can grow over time and sometimes get irritated or inflamed.  Cyst with symptoms and/or recent change.  Discussed surgical excision to remove, including resulting scar and possible recurrence.  Patient will schedule for surgery. Pre-op information given.   Pt requests that this visit be canceled, since it was scheduled incorrectly and she was under the impression that the surgery would be done today and she took work off for the procedure.  Pt was scheduled today for excision in 2 weeks with Dr. Neale Burly.   Return for Cyst Excision as scheduled.  I, Lawson Radar, CMA, am acting as scribe for Willeen Niece, MD.   Documentation: I have reviewed the above documentation for accuracy and completeness, and I agree with the above.  Willeen Niece, MD

## 2022-06-14 NOTE — Patient Instructions (Signed)
 Pre-Operative Instructions  You are scheduled for a surgical procedure at [facility]. We recommend you read the following instructions. If you have any questions or concerns, please call the office at [phone].  Shower and wash the entire body with soap and water the day of your surgery paying special attention to cleansing at and around the planned surgery site.  Avoid aspirin or aspirin containing products at least fourteen (14) days prior to your surgical procedure and for at least one week (7 Days) after your surgical procedure. If you take aspirin on a regular basis for heart disease or history of stroke or for any other reason, we may recommend you continue taking aspirin but please notify us if you take this on a regular basis. Aspirin can cause more bleeding to occur during surgery as well as prolonged bleeding and bruising after surgery.   Avoid other nonsteroidal pain medications at least one week prior to surgery and at least one week prior to your surgery. These include medications such as Ibuprofen (Motrin, Advil and Nuprin), Naprosyn, Voltaren, Relafen, etc. If medications are used for therapeutic reasons, please inform us as they can cause increased bleeding or prolonged bleeding during and bruising after surgical procedures.   Please advise us if you are taking any "blood thinner" medications such as Coumadin or Dipyridamole or Plavix or similar medications. These cause increased bleeding and prolonged bleeding during procedures and bruising after surgical procedures. We may have to consider discontinuing these medications briefly prior to and shortly after your surgery if safe to do so.   Please inform us of all medications you are currently taking. All medications that are taken regularly should be taken the day of surgery as you always do. Nevertheless, we need to be informed of what medications you are taking prior to surgery to know whether they will affect the  procedure or cause any complications.   Please inform us of any medication allergies. Also inform us of whether you have allergies to Latex or rubber products or whether you have had any adverse reaction to Lidocaine or Epinephrine.  Please inform us of any prosthetic or artificial body parts such as artificial heart valve, joint replacements, etc., or similar condition that might require preoperative antibiotics.   We recommend avoidance of alcohol at least two weeks prior to surgery and continued avoidance for at least two weeks after surgery.   We recommend discontinuation of tobacco smoking at least two weeks prior to surgery and continued abstinence for at least two weeks after surgery.  Do not plan strenuous exercise, strenuous work or strenuous lifting for approximately four weeks after your surgery.   We request if you are unable to make your scheduled surgical appointment, please call us at least a week in advance or as soon as you are aware of a problem so that we can cancel or reschedule the appointment.   You MAY TAKE TYLENOL (acetaminophen) for pain as it is not a blood thinner.   PLEASE PLAN TO BE IN TOWN FOR TWO WEEKS FOLLOWING SURGERY, THIS IS IMPORTANT SO YOU CAN BE CHECKED FOR DRESSING CHANGES, SUTURE REMOVAL AND TO MONITOR FOR POSSIBLE COMPLICATIONS.    Due to recent changes in healthcare laws, you may see results of your pathology and/or laboratory studies on MyChart before the doctors have had a chance to review them. We understand that in some cases there may be results that are confusing or concerning to you. Please understand that not all results are   received at the same time and often the doctors may need to interpret multiple results in order to provide you with the best plan of care or course of treatment. Therefore, we ask that you please give us 2 business days to thoroughly review all your results before contacting the office for clarification. Should we see a  critical lab result, you will be contacted sooner.   If You Need Anything After Your Visit  If you have any questions or concerns for your doctor, please call our main line at [phone] and press option 4 to reach your doctor's medical assistant. If no one answers, please leave a voicemail as directed and we will return your call as soon as possible. Messages left after 4 pm will be answered the following business day.   You may also send us a message via MyChart. We typically respond to MyChart messages within 1-2 business days.  For prescription refills, please ask your pharmacy to contact our office. Our fax number is [phone].  If you have an urgent issue when the clinic is closed that cannot wait until the next business day, you can page your doctor at the number below.    Please note that while we do our best to be available for urgent issues outside of office hours, we are not available 24/7.   If you have an urgent issue and are unable to reach us, you may choose to seek medical care at your doctor's office, retail clinic, urgent care center, or emergency room.  If you have a medical emergency, please immediately call 911 or go to the emergency department.  Pager Numbers  - Dr. Kowalski: [phone]  - Dr. Moye: [phone]  - Dr. Stewart: [phone]  In the event of inclement weather, please call our main line at [phone] for an update on the status of any delays or closures.  Dermatology Medication Tips: Please keep the boxes that topical medications come in in order to help keep track of the instructions about where and how to use these. Pharmacies typically print the medication instructions only on the boxes and not directly on the medication tubes.   If your medication is too expensive, please contact our office at [phone] option 4 or send us a message through MyChart.   We are unable to tell what your co-pay for medications will be in advance as  this is different depending on your insurance coverage. However, we may be able to find a substitute medication at lower cost or fill out paperwork to get insurance to cover a needed medication.   If a prior authorization is required to get your medication covered by your insurance company, please allow us 1-2 business days to complete this process.  Drug prices often vary depending on where the prescription is filled and some pharmacies may offer cheaper prices.  The website www.goodrx.com contains coupons for medications through different pharmacies. The prices here do not account for what the cost may be with help from insurance (it may be cheaper with your insurance), but the website can give you the price if you did not use any insurance.  - You can print the associated coupon and take it with your prescription to the pharmacy.  - You may also stop by our office during regular business hours and pick up a GoodRx coupon card.  - If you need your prescription sent electronically to a different pharmacy, notify our office through [facility] My[facility] or by phone at [phone] option 4.       Si Usted Necesita Algo Despus de Su Visita  Tambin puede enviarnos un mensaje a travs de MyChart. Por lo general respondemos a los mensajes de MyChart en el transcurso de 1 a 2 das hbiles.  Para renovar recetas, por favor pida a su farmacia que se ponga en contacto con nuestra oficina. Nuestro nmero de fax es el [phone].  Si tiene un asunto urgente cuando la clnica est cerrada y que no puede esperar hasta el siguiente da hbil, puede llamar/localizar a su doctor(a) al nmero que aparece a continuacin.   Por favor, tenga en cuenta que aunque hacemos todo lo posible para estar disponibles para asuntos urgentes fuera del horario de oficina, no estamos disponibles las 24 horas del da, los 7 das de la semana.   Si tiene un problema urgente y no puede comunicarse con nosotros, puede optar por  buscar atencin mdica  en el consultorio de su doctor(a), en una clnica privada, en un centro de atencin urgente o en una sala de emergencias.  Si tiene una emergencia mdica, por favor llame inmediatamente al 911 o vaya a la sala de emergencias.  Nmeros de bper  - Dr. Kowalski: [phone]  - Dra. Moye: [phone]  - Dra. Stewart: [phone]  En caso de inclemencias del tiempo, por favor llame a nuestra lnea principal al [phone] para una actualizacin sobre el estado de cualquier retraso o cierre.  Consejos para la medicacin en dermatologa: Por favor, guarde las cajas en las que vienen los medicamentos de uso tpico para ayudarle a seguir las instrucciones sobre dnde y cmo usarlos. Las farmacias generalmente imprimen las instrucciones del medicamento slo en las cajas y no directamente en los tubos del medicamento.   Si su medicamento es muy caro, por favor, pngase en contacto con nuestra oficina llamando al [phone] y presione la opcin 4 o envenos un mensaje a travs de MyChart.   No podemos decirle cul ser su copago por los medicamentos por adelantado ya que esto es diferente dependiendo de la cobertura de su seguro. Sin embargo, es posible que podamos encontrar un medicamento sustituto a menor costo o llenar un formulario para que el seguro cubra el medicamento que se considera necesario.   Si se requiere una autorizacin previa para que su compaa de seguros cubra su medicamento, por favor permtanos de 1 a 2 das hbiles para completar este proceso.  Los precios de los medicamentos varan con frecuencia dependiendo del lugar de dnde se surte la receta y alguna farmacias pueden ofrecer precios ms baratos.  El sitio web www.goodrx.com tiene cupones para medicamentos de diferentes farmacias. Los precios aqu no tienen en cuenta lo que podra costar con la ayuda del seguro (puede ser ms barato con su seguro), pero el sitio web puede darle el precio si no  utiliz ningn seguro.  - Puede imprimir el cupn correspondiente y llevarlo con su receta a la farmacia.  - Tambin puede pasar por nuestra oficina durante el horario de atencin regular y recoger una tarjeta de cupones de GoodRx.  - Si necesita que su receta se enve electrnicamente a una farmacia diferente, informe a nuestra oficina a travs de My[facility] o por telfono llamando al [phone] y presione la opcin 4.  

## 2022-06-28 ENCOUNTER — Ambulatory Visit: Payer: BC Managed Care – PPO | Admitting: Dermatology

## 2022-06-28 ENCOUNTER — Encounter: Payer: Self-pay | Admitting: Dermatology

## 2022-06-28 VITALS — BP 132/70 | HR 80

## 2022-06-28 DIAGNOSIS — L570 Actinic keratosis: Secondary | ICD-10-CM | POA: Diagnosis not present

## 2022-06-28 DIAGNOSIS — L7211 Pilar cyst: Secondary | ICD-10-CM | POA: Diagnosis not present

## 2022-06-28 DIAGNOSIS — D492 Neoplasm of unspecified behavior of bone, soft tissue, and skin: Secondary | ICD-10-CM

## 2022-06-28 MED ORDER — MUPIROCIN 2 % EX OINT: 1.0000 | TOPICAL_OINTMENT | Freq: Three times a day (TID) | CUTANEOUS | 0 refills | Status: AC

## 2022-06-28 MED ORDER — FLUOROURACIL 5 % EX CREA: TOPICAL_CREAM | Freq: Two times a day (BID) | CUTANEOUS | 0 refills | Status: AC

## 2022-06-28 NOTE — Patient Instructions (Addendum)
Wound Care Instructions for After Surgery  On the day following your surgery, you should begin doing daily dressing changes until your sutures are removed: Remove the bandage. Cleanse the wound gently with soap and water.  Make sure you then dry the skin surrounding the wound completely or the tape will not stick to the skin. Do not use cotton balls on the wound. After the wound is clean and dry, apply the ointment (either prescription antibiotic prescribed by your doctor or plain Vaseline if nothing was prescribed) gently with a Q-tip. If you are using a bandaid to cover: Apply a bandaid large enough to cover the entire wound. If you do not have a bandaid large enough to cover the wound OR if you are sensitive to bandaid adhesive: Cut a non-stick pad (such as Telfa) to fit the size of the wound.  Cover the wound with the non-stick pad. If the wound is draining, you may want to add a small amount of gauze on top of the non-stick pad for a little added compression to the area. Use tape to seal the area completely.  For the next 1-2 weeks: Be sure to keep the wound moist with ointment 24/7 to ensure best healing. If you are unable to cover the wound with a bandage to hold the ointment in place, you may need to reapply the ointment several times a day. Do not bend over or lift heavy items to reduce the chance of elevated blood pressure to the wound. Do not participate in particularly strenuous activities.  Below is a list of dressing supplies you might need.  Cotton-tipped applicators - Q-tips Gauze pads (2x2 and/or 4x4) - [employer] Sponges New and clean tube of petroleum jelly (Vaseline) OR prescription antibiotic ointment if prescribed Either a bandaid large enough to cover the entire wound OR non-stick dressing material (Telfa) and Tape (Paper or Hypafix)  FOR ADULT SURGERY PATIENTS: If you need something for pain relief, you may take 1 extra strength Tylenol (acetaminophen) and 2  ibuprofen (200 mg) together every 4 hours as needed. (Do not take these medications if you are allergic to them or if you know you cannot take them for any other reason). Typically you may only need pain medication for 1-3 days.   Comments on the Post-Operative Period Slight swelling and redness often appear around the wound. This is normal and will disappear within several days following the surgery. The healing wound will drain a brownish-red-yellow discharge during healing. This is a normal phase of wound healing. As the wound begins to heal, the drainage may increase in amount. Again, this drainage is normal. Notify us if the drainage becomes persistently bloody, excessively swollen, or intensely painful or develops a foul odor or red streaks.  The healing wound will also typically be itchy. This is normal. If you have severe or persistent pain, Notify us if the discomfort is severe or persistent. Avoid alcoholic beverages when taking pain medicine.  In Case of Wound Hemorrhage A wound hemorrhage is when the bandage suddenly becomes soaked with bright red blood and flows profusely. If this happens, sit down or lie down with your head elevated. If the wound has a dressing on it, do not remove the dressing. Apply pressure to the existing gauze. If the wound is not covered, use a gauze pad to apply pressure and continue applying the pressure for 20 minutes without peeking. DO NOT COVER THE WOUND WITH A LARGE TOWEL OR WASH CLOTH. Release your hand from the   wound site but do not remove the dressing. If the bleeding has stopped, gently clean around the wound. Leave the dressing in place for 24 hours if possible. This wait time allows the blood vessels to close off so that you do not spark a new round of bleeding by disrupting the newly clotted blood vessels with an immediate dressing change. If the bleeding does not subside, continue to hold pressure for 40 minutes. If bleeding continues, page your  physician, contact an After Hours clinic or go to the Emergency Room.  Due to recent changes in healthcare laws, you may see results of your pathology and/or laboratory studies on MyChart before the doctors have had a chance to review them. We understand that in some cases there may be results that are confusing or concerning to you. Please understand that not all results are received at the same time and often the doctors may need to interpret multiple results in order to provide you with the best plan of care or course of treatment. Therefore, we ask that you please give [place] 2 business days to thoroughly review all your results before contacting the office for clarification. Should we see a critical lab result, you will be contacted sooner.    5-Fluorouracil Patient Education   Actinic keratoses are the dry, red scaly spots on the skin caused by sun damage. A portion of these spots can turn into skin cancer with time, and treating them can help prevent development of skin cancer.   Treatment of these spots requires removal of the defective skin cells. There are various ways to remove actinic keratoses, including freezing with liquid nitrogen, treatment with creams, or treatment with a blue light procedure in the office.   5-fluorouracil cream is a topical cream used to treat actinic keratoses. It works by interfering with the growth of abnormal fast-growing skin cells, such as actinic keratoses. These cells peel off and are replaced by healthy ones. THIS CREAM SHOULD BE KEPT OUT OF REACH OF CHILDREN AND PETS AND SHOULD NOT BE USED BY PREGNANT WOMEN.  INSTRUCTIONS FOR 5-FLUOROURACIL CREAM:   5-fluorouracil cream typically needs to be used for 7-14 days depending on the location of treatment. A thin layer should be applied twice a day to the treatment areas recommended by your physician.    Avoid contact with your eyes or nostrils. Avoid applying the cream to your eyelids or lips unless directed to  apply there by your physician. Do not use 5-fluorouracil on infected or open wounds.   You will develop redness, irritation and some crusting at areas where you have pre-cancer damage/actinic keratoses. IF YOU DEVELOP PAIN, BLEEDING, OR SIGNIFICANT CRUSTING, STOP THE TREATMENT EARLY - you have already gotten a good response and the actinic keratoses should clear up well.  Wash your hands after applying 5-fluorouracil 5% cream on your skin.   A moisturizer or sunscreen with a minimum SPF 30 should be applied each morning.   Once you have finished the treatment, you can apply a thin layer of Vaseline twice a day to irritated areas to soothe and calm the areas more quickly. If you experience significant discomfort, contact your physician.  For some patients it is necessary to repeat the treatment for best results.  SIDE EFFECTS: When using 5-fluorouracil cream, you may have mild irritation, such as redness, dryness, swelling, or a mild burning sensation. This usually resolves within 2 weeks. The more actinic keratoses you have, the more redness and inflammation you can expect during  treatment. Eye irritation has been reported rarely. If this occurs, please let us know.   If you have any trouble using this cream, please send [place] a MyChart Message or call the office. If you have any other questions about this information, please do not hesitate to ask me before you leave the office or reach out on MyChart or by phone.  - Start 5-fluorouracil cream twice a day for 7 days to affected areas including left nose.  Reviewed course of treatment and expected reaction.  Patient advised to expect inflammation and crusting and advised that erosions are possible.  Patient advised to be diligent with sun protection during and after treatment. Handout with details of how to apply medication and what to expect provided. Counseled to keep medication out of reach of children and pets.   Your prescription was sent to  [facility] in [place]. A representative from [employer] will contact you within 2 business hours to verify your address and insurance information to schedule a free delivery. If for any reason you do not receive a phone call from them, please reach out to them. Their phone number is [phone] and their hours are Monday-Friday 9:00 am-5:00 pm.     If You Need Anything After Your Visit  If you have any questions or concerns for your doctor, please call our main line at [phone] and press option 4 to reach your doctor's medical assistant. If no one answers, please leave a voicemail as directed and we will return your call as soon as possible. Messages left after 4 pm will be answered the following business day.   You may also send [place] a message via MyChart. We typically respond to MyChart messages within 1-2 business days.  For prescription refills, please ask your pharmacy to contact our office. Our fax number is [phone].  If you have an urgent issue when the clinic is closed that cannot wait until the next business day, you can page your doctor at the number below.    Please note that while we do our best to be available for urgent issues outside of office hours, we are not available 24/7.   If you have an urgent issue and are unable to reach [place], you may choose to seek medical care at your doctor's office, retail clinic, urgent care center, or emergency room.  If you have a medical emergency, please immediately call 911 or go to the emergency department.  Pager Numbers  - Dr. Gwen Pounds: [phone]  - Dr. Neale Burly: [phone]  - Dr. Roseanne Reno: [phone]  In the event of inclement weather, please call our main line at [phone] for an update on the status of any delays or closures.  Dermatology Medication Tips: Please keep the boxes that topical medications come in in order to help keep track of the instructions about where and how to use these. Pharmacies  typically print the medication instructions only on the boxes and not directly on the medication tubes.   If your medication is too expensive, please contact our office at [phone] option 4 or send [place] a message through MyChart.   We are unable to tell what your co-pay for medications will be in advance as this is different depending on your insurance coverage. However, we may be able to find a substitute medication at lower cost or fill out paperwork to get insurance to cover a needed medication.   If a prior authorization is required to get your medication covered by your insurance company,  please allow [place] 1-2 business days to complete this process.  Drug prices often vary depending on where the prescription is filled and some pharmacies may offer cheaper prices.  The website www.goodrx.com contains coupons for medications through different pharmacies. The prices here do not account for what the cost may be with help from insurance (it may be cheaper with your insurance), but the website can give you the price if you did not use any insurance.  - You can print the associated coupon and take it with your prescription to the pharmacy.  - You may also stop by our office during regular business hours and pick up a GoodRx coupon card.  - If you need your prescription sent electronically to a different pharmacy, notify our office through [facility] or by phone at [phone] option 4.

## 2022-06-28 NOTE — Progress Notes (Unsigned)
Follow-Up Visit   Subjective  Lori Jenkins is a 57 y.o. female who presents for the following: Excision of pilar cyst at right parietal scalp  The following portions of the chart were reviewed this encounter and updated as appropriate: medications, allergies, medical history  Review of Systems:  No other skin or systemic complaints except as noted in HPI or Assessment and Plan.  Objective  Well appearing patient in no apparent distress; mood and affect are within normal limits.  A focused examination was performed of the following areas: scalp Relevant physical exam findings are noted in the Assessment and Plan.   Right Parietal Scalp 1.1 cm firm subcutaneous nodule     Assessment & Plan   Neoplasm of skin Right Parietal Scalp  Skin excision  Lesion length (cm):  1.1 Lesion width (cm):  1.1 Total excision diameter (cm):  1.1 Informed consent: discussed and consent obtained   Timeout: patient name, date of birth, surgical site, and procedure verified   Procedure prep:  Patient was prepped and draped in usual sterile fashion Prep type:  Chlorhexidine Anesthesia: the lesion was anesthetized in a standard fashion   Anesthetic:  1% lidocaine w/ epinephrine 1-100,000 buffered w/ 8.4% NaHCO3 (6cc) Instrument used comment:  15c Hemostasis achieved with: pressure and electrodesiccation    Skin repair Complexity:  Intermediate Final length (cm):  1.4 Informed consent: discussed and consent obtained   Timeout: patient name, date of birth, surgical site, and procedure verified   Procedure prep:  Patient was prepped and draped in usual sterile fashion Prep type:  Chlorhexidine Anesthesia: the lesion was anesthetized in a standard fashion   Anesthetic:  1% lidocaine w/ epinephrine 1-100,000 local infiltration Reason for type of repair: reduce tension to allow closure, reduce the risk of dehiscence, infection, and necrosis, preserve normal anatomical and functional relationships  and enhance both functionality and cosmetic results   Undermining: edges undermined   Subcutaneous layers (deep stitches):  Suture type: Vicryl (polyglactin 910)   Stitches:  Buried vertical mattress Fine/surface layer approximation (top stitches):  Suture size:  3-0 Suture type: Prolene (polypropylene)   Hemostasis achieved with: suture, pressure and electrodesiccation Outcome: patient tolerated procedure well with no complications   Post-procedure details: wound care instructions given   Additional details:  Mupirocin and a pressure dressing applied  Specimen 1 - Surgical pathology Differential Diagnosis: r/o recurrent Pilar Cyst or other  Check Margins: No 1.1 cm firm subcutaneous nodule  Patient will remove sutures in 2 weeks at home. Disposable suture removal kit given to patient.     ACTINIC KERATOSIS Exam: Erythematous thin papules/macules with gritty scale at the left nose  Actinic keratoses are precancerous spots that appear secondary to cumulative UV radiation exposure/sun exposure over time. They are chronic with expected duration over 1 year. A portion of actinic keratoses will progress to squamous cell carcinoma of the skin. It is not possible to reliably predict which spots will progress to skin cancer and so treatment is recommended to prevent development of skin cancer.  Recommend daily broad spectrum sunscreen SPF 30+ to sun-exposed areas, reapply every 2 hours as needed.  Recommend staying in the shade or wearing long sleeves, sun glasses (UVA+UVB protection) and wide brim hats (4-inch brim around the entire circumference of the hat). Call for new or changing lesions.  Treatment Plan: Start 5-fluorouracil cream twice a day for 7 days to affected areas including left nose in 1 month after scalp has completely healed. Reviewed course of treatment  and expected reaction.  Patient advised to expect inflammation and crusting and advised that erosions are possible.   Patient advised to be diligent with sun protection during and after treatment. Handout with details of how to apply medication and what to expect provided. Counseled to keep medication out of reach of children and pets.  Reviewed course of treatment and expected reaction.  Patient advised to expect inflammation and crusting and advised that erosions are possible.  Patient advised to be diligent with sun protection during and after treatment. Handout with details of how to apply medication and what to expect provided. Counseled to keep medication out of reach of children and pets.  Return in about 3 months (around 09/28/2022) for TBSE, Fhx MM.  Anise Salvo, RMA, am acting as scribe for Darden Dates, MD .   Documentation: I have reviewed the above documentation for accuracy and completeness, and I agree with the above.  Darden Dates, MD

## 2022-07-06 ENCOUNTER — Telehealth: Payer: Self-pay

## 2022-07-06 NOTE — Telephone Encounter (Signed)
-----   Message from Sandi Mealy, MD sent at 07/05/2022  5:38 PM EDT ----- Skin (M), right parietal scalp PILAR CYST --> normal cyst, no additional treatment needed.   MAs please call. Thank you!

## 2022-07-06 NOTE — Telephone Encounter (Signed)
Called patient. N/A. LMOVM to return my call.  

## 2022-07-12 ENCOUNTER — Telehealth: Payer: Self-pay

## 2022-07-12 NOTE — Telephone Encounter (Signed)
Patient advised pathology showed benign pilar cyst. Butch Penny., RMA

## 2022-07-12 NOTE — Telephone Encounter (Signed)
-----   Message from Virginia Moye, MD sent at 07/05/2022  5:38 PM EDT ----- Skin (M), right parietal scalp PILAR CYST --> normal cyst, no additional treatment needed.   MAs please call. Thank you! 

## 2022-10-12 ENCOUNTER — Encounter: Payer: BC Managed Care – PPO | Admitting: Dermatology

## 2022-11-09 ENCOUNTER — Encounter: Payer: BC Managed Care – PPO | Admitting: Dermatology

## 2022-12-04 ENCOUNTER — Encounter: Payer: BC Managed Care – PPO | Admitting: Dermatology

## 2022-12-26 ENCOUNTER — Ambulatory Visit (INDEPENDENT_AMBULATORY_CARE_PROVIDER_SITE_OTHER): Payer: BC Managed Care – PPO | Admitting: Dermatology

## 2022-12-26 ENCOUNTER — Encounter: Payer: Self-pay | Admitting: Dermatology

## 2022-12-26 DIAGNOSIS — Z1283 Encounter for screening for malignant neoplasm of skin: Secondary | ICD-10-CM

## 2022-12-26 DIAGNOSIS — L814 Other melanin hyperpigmentation: Secondary | ICD-10-CM | POA: Diagnosis not present

## 2022-12-26 DIAGNOSIS — L578 Other skin changes due to chronic exposure to nonionizing radiation: Secondary | ICD-10-CM

## 2022-12-26 DIAGNOSIS — L570 Actinic keratosis: Secondary | ICD-10-CM

## 2022-12-26 DIAGNOSIS — D229 Melanocytic nevi, unspecified: Secondary | ICD-10-CM

## 2022-12-26 DIAGNOSIS — W908XXA Exposure to other nonionizing radiation, initial encounter: Secondary | ICD-10-CM

## 2022-12-26 DIAGNOSIS — L900 Lichen sclerosus et atrophicus: Secondary | ICD-10-CM

## 2022-12-26 DIAGNOSIS — D1801 Hemangioma of skin and subcutaneous tissue: Secondary | ICD-10-CM

## 2022-12-26 DIAGNOSIS — Z808 Family history of malignant neoplasm of other organs or systems: Secondary | ICD-10-CM

## 2022-12-26 DIAGNOSIS — L821 Other seborrheic keratosis: Secondary | ICD-10-CM

## 2022-12-26 MED ORDER — CLOBETASOL PROPIONATE 0.05 % EX OINT: TOPICAL_OINTMENT | CUTANEOUS | 1 refills | Status: AC

## 2022-12-26 NOTE — Progress Notes (Signed)
Follow-Up Visit   Subjective  Lori Jenkins is a 57 y.o. female who presents for the following: Skin Cancer Screening and Full Body Skin Exam. No personal Hx of skin cancer. Grandmother had Hx of MM, was COD.    Check right plantar foot. Bx from 1/61/0960 showed lichen sclerosus. Did not use Clobetasol ointment, per patient. Will not resolve.   The patient presents for Total-Body Skin Exam (TBSE) for skin cancer screening and mole check. The patient has spots, moles and lesions to be evaluated, some may be new or changing and the patient may have concern these could be cancer.    The following portions of the chart were reviewed this encounter and updated as appropriate: medications, allergies, medical history  Review of Systems:  No other skin or systemic complaints except as noted in HPI or Assessment and Plan.  Objective  Well appearing patient in no apparent distress; mood and affect are within normal limits.  A full examination was performed including scalp, head, eyes, ears, nose, lips, neck, chest, axillae, abdomen, back, buttocks, bilateral upper extremities, bilateral lower extremities, hands, feet, fingers, toes, fingernails, and toenails. All findings within normal limits unless otherwise noted below.   Relevant physical exam findings are noted in the Assessment and Plan.  Left Paranasal Very thin, pink scaly lesion    Assessment & Plan   FAMILY HISTORY OF SKIN CANCER What type(s): MM Who affected: Maternal Grandmother, COD   SKIN CANCER SCREENING PERFORMED TODAY.  ACTINIC DAMAGE - Chronic condition, secondary to cumulative UV/sun exposure - diffuse scaly erythematous macules with underlying dyspigmentation - Recommend daily broad spectrum sunscreen SPF 30+ to sun-exposed areas, reapply every 2 hours as needed.  - Staying in the shade or wearing long sleeves, sun glasses (UVA+UVB protection) and wide brim hats (4-inch brim around the entire circumference of the  hat) are also recommended for sun protection.  - Call for new or changing lesions.  LENTIGINES, SEBORRHEIC KERATOSES, HEMANGIOMAS - Benign normal skin lesions - Benign-appearing - Call for any changes  MELANOCYTIC NEVI - Tan-brown and/or pink-flesh-colored symmetric macules and papules - Benign appearing on exam today - Observation - Call clinic for new or changing moles - Recommend daily use of broad spectrum spf 30+ sunscreen to sun-exposed areas.    LICHEN SCLEROSUS  Exam: erythematous scaly patch at right lateral foot.   Chronic and persistent condition with duration or expected duration over one year. Condition is symptomatic/ bothersome to patient. Not currently at goal.   Lichen sclerosus is a chronic inflammatory condition of unknown cause that frequently involves the vaginal area and less commonly extragenital skin, and is NOT sexually transmitted. It frequently causes symptoms of pain and burning.  It requires regular monitoring and treatment with topical steroids to minimize inflammation and to reduce risk of scarring. There is also a risk of cancer in the vaginal area which is very low if inflammation is well controlled. Regular checks of the area are recommended. Please call if you notice any new or changing spots within this area.  Treatment Plan: Discussed repeat biopsy vs 2nd opinion on first biopsy or use Clobetasol ointment twice daily for 2 weeks as directed to see if resolves.    If not resolved in 2 weeks patient prefers sending previous bx slides out for 2nd opinion. She will send MyChart message.   Start Clobetasol ointment twice daily for 2 weeks as directed to see if resolves. Avoid applying to face, groin, and axilla.   AK (  actinic keratosis) Left Paranasal  Actinic keratoses are precancerous spots that appear secondary to cumulative UV radiation exposure/sun exposure over time. They are chronic with expected duration over 1 year. A portion of actinic  keratoses will progress to squamous cell carcinoma of the skin. It is not possible to reliably predict which spots will progress to skin cancer and so treatment is recommended to prevent development of skin cancer.  Recommend daily broad spectrum sunscreen SPF 30+ to sun-exposed areas, reapply every 2 hours as needed.  Recommend staying in the shade or wearing long sleeves, sun glasses (UVA+UVB protection) and wide brim hats (4-inch brim around the entire circumference of the hat). Call for new or changing lesions.  Discussed LN2 vs observation. Patient prefers observation for now.    Return in about 1 year (around 12/26/2023) for TBSE, FmHx MM.  I, Lawson Radar, CMA, am acting as scribe for Elie Goody, MD.   Documentation: I have reviewed the above documentation for accuracy and completeness, and I agree with the above.  Elie Goody, MD

## 2022-12-26 NOTE — Patient Instructions (Addendum)
Start Clobetasol ointment twice daily for 2 weeks as directed to see if resolves. Avoid applying to face, groin, and axilla.   If not resolved in 2 weeks send MyChart message to send pathology out for 2nd opinion.    Recommend daily broad spectrum sunscreen SPF 30+ to sun-exposed areas, reapply every 2 hours as needed. Call for new or changing lesions.  Staying in the shade or wearing long sleeves, sun glasses (UVA+UVB protection) and wide brim hats (4-inch brim around the entire circumference of the hat) are also recommended for sun protection.       Melanoma ABCDEs  Melanoma is the most dangerous type of skin cancer, and is the leading cause of death from skin disease.  You are more likely to develop melanoma if you: Have light-colored skin, light-colored eyes, or red or blond hair Spend a lot of time in the sun Tan regularly, either outdoors or in a tanning bed Have had blistering sunburns, especially during childhood Have a close family member who has had a melanoma Have atypical moles or large birthmarks  Early detection of melanoma is key since treatment is typically straightforward and cure rates are extremely high if we catch it early.   The first sign of melanoma is often a change in a mole or a new dark spot.  The ABCDE system is a way of remembering the signs of melanoma.  A for asymmetry:  The two halves do not match. B for border:  The edges of the growth are irregular. C for color:  A mixture of colors are present instead of an even brown color. D for diameter:  Melanomas are usually (but not always) greater than 6mm - the size of a pencil eraser. E for evolution:  The spot keeps changing in size, shape, and color.  Please check your skin once per month between visits. You can use a small mirror in front and a large mirror behind you to keep an eye on the back side or your body.   If you see any new or changing lesions before your next follow-up, please call to  schedule a visit.  Please continue daily skin protection including broad spectrum sunscreen SPF 30+ to sun-exposed areas, reapplying every 2 hours as needed when you're outdoors.   Staying in the shade or wearing long sleeves, sun glasses (UVA+UVB protection) and wide brim hats (4-inch brim around the entire circumference of the hat) are also recommended for sun protection.       Due to recent changes in healthcare laws, you may see results of your pathology and/or laboratory studies on MyChart before the doctors have had a chance to review them. We understand that in some cases there may be results that are confusing or concerning to you. Please understand that not all results are received at the same time and often the doctors may need to interpret multiple results in order to provide you with the best plan of care or course of treatment. Therefore, we ask that you please give [place] 2 business days to thoroughly review all your results before contacting the office for clarification. Should we see a critical lab result, you will be contacted sooner.   If You Need Anything After Your Visit  If you have any questions or concerns for your doctor, please call our main line at [phone] and press option 4 to reach your doctor's medical assistant. If no one answers, please leave a voicemail as directed and we will return your call  as soon as possible. Messages left after 4 pm will be answered the following business day.   You may also send [place] a message via MyChart. We typically respond to MyChart messages within 1-2 business days.  For prescription refills, please ask your pharmacy to contact our office. Our fax number is [phone].  If you have an urgent issue when the clinic is closed that cannot wait until the next business day, you can page your doctor at the number below.    Please note that while we do our best to be available for urgent issues outside of office hours, we are not available  24/7.   If you have an urgent issue and are unable to reach [place], you may choose to seek medical care at your doctor's office, retail clinic, urgent care center, or emergency room.  If you have a medical emergency, please immediately call 911 or go to the emergency department.  Pager Numbers  - Dr. Gwen Pounds: [phone]  - Dr. Roseanne Reno: [phone]  - Dr. Katrinka Blazing: [phone]   In the event of inclement weather, please call our main line at [phone] for an update on the status of any delays or closures.  Dermatology Medication Tips: Please keep the boxes that topical medications come in in order to help keep track of the instructions about where and how to use these. Pharmacies typically print the medication instructions only on the boxes and not directly on the medication tubes.   If your medication is too expensive, please contact our office at [phone] option 4 or send [place] a message through MyChart.   We are unable to tell what your co-pay for medications will be in advance as this is different depending on your insurance coverage. However, we may be able to find a substitute medication at lower cost or fill out paperwork to get insurance to cover a needed medication.   If a prior authorization is required to get your medication covered by your insurance company, please allow [place] 1-2 business days to complete this process.  Drug prices often vary depending on where the prescription is filled and some pharmacies may offer cheaper prices.  The website www.goodrx.com contains coupons for medications through different pharmacies. The prices here do not account for what the cost may be with help from insurance (it may be cheaper with your insurance), but the website can give you the price if you did not use any insurance.  - You can print the associated coupon and take it with your prescription to the pharmacy.  - You may also stop by our office during regular business hours and pick  up a GoodRx coupon card.  - If you need your prescription sent electronically to a different pharmacy, notify our office through [facility] or by phone at [phone] option 4.     Si Usted Necesita Algo Despus de Su Visita  Tambin puede enviarnos un mensaje a travs de Clinical cytogeneticist. Por lo general respondemos a los mensajes de MyChart en el transcurso de 1 a 2 das hbiles.  Para renovar recetas, por favor pida a su farmacia que se ponga en contacto con nuestra oficina. Annie Sable de fax es [place] [phone].  Si tiene un asunto urgente cuando la clnica est cerrada y que no puede esperar hasta el siguiente da hbil, puede llamar/localizar a su doctor(a) al nmero que aparece a continuacin.   Por favor, tenga en cuenta que aunque hacemos todo lo posible para estar disponibles para asuntos urgentes fuera  del horario de oficina, no estamos disponibles las 24 horas del da, los 7 [address]  [address] de la [place].   Si tiene un problema urgente y no puede comunicarse con nosotros, puede optar por buscar atencin mdica  en el consultorio de su doctor(a), en una clnica privada, en un centro de atencin urgente o en una sala de emergencias.  Si tiene Engineer, drilling, por favor llame inmediatamente al 911 o vaya a la sala de emergencias.  Nmeros de bper  - Dr. Gwen Pounds: [phone]  - Dra. Roseanne Reno: [phone]  - Dr. Katrinka Blazing: [phone]   En caso de inclemencias del tiempo, por favor llame a Lacy Duverney principal al [phone] para una actualizacin sobre el [place] de cualquier retraso o cierre.  Consejos para la medicacin en dermatologa: Por favor, guarde las cajas en las que vienen los medicamentos de uso tpico para ayudarle a seguir las instrucciones sobre dnde y cmo usarlos. Las farmacias generalmente imprimen las instrucciones del medicamento slo en las cajas y no directamente en los tubos del [employer].   Si su medicamento es muy caro, por favor, pngase en  contacto con Rolm Gala llamando al [phone] y presione la opcin 4 o envenos un mensaje a travs de Clinical cytogeneticist.   No podemos decirle cul ser su copago por los medicamentos por adelantado ya que esto es diferente dependiendo de la cobertura de su seguro. Sin embargo, es posible que podamos encontrar un medicamento sustituto a Audiological scientist un formulario para que el seguro cubra el medicamento que se considera necesario.   Si se requiere una autorizacin previa para que su compaa de seguros [place] su medicamento, por favor permtanos de 1 a 2 das hbiles para completar [address]  Los precios de los medicamentos varan con frecuencia dependiendo del Environmental consultant de dnde se surte la receta y alguna farmacias pueden ofrecer precios ms baratos.  El sitio web www.goodrx.com tiene cupones para medicamentos de Health and safety inspector. Los precios aqu no tienen en cuenta lo que podra costar con la ayuda del seguro (puede ser ms barato con su seguro), pero el sitio web puede darle el precio si no utiliz Tourist information centre manager.  - Puede imprimir el cupn correspondiente y llevarlo con su receta a la farmacia.  - Tambin puede pasar por nuestra oficina durante el horario de atencin regular y Education officer, museum una tarjeta de cupones de GoodRx.  - Si necesita que su receta se enve electrnicamente a una farmacia diferente, informe a nuestra oficina a travs de MyChart de [place] o por telfono llamando al [phone] y presione la opcin 4.

## 2023-02-15 ENCOUNTER — Other Ambulatory Visit: Payer: Self-pay | Admitting: Family Medicine

## 2023-02-15 DIAGNOSIS — Z1231 Encounter for screening mammogram for malignant neoplasm of breast: Secondary | ICD-10-CM

## 2023-12-27 ENCOUNTER — Ambulatory Visit: Payer: BC Managed Care – PPO | Admitting: Dermatology

## 2023-12-31 ENCOUNTER — Ambulatory Visit: Admitting: Dermatology
